# Patient Record
Sex: Female | Born: 1990 | Race: White | Hispanic: No | Marital: Single | Smoking: Never smoker
Health system: Southern US, Community
[De-identification: ages and names within clinical notes are randomized; demographics above are authoritative.]

## PROBLEM LIST (undated history)

## (undated) ENCOUNTER — Inpatient Hospital Stay: Payer: Self-pay

## (undated) DIAGNOSIS — D649 Anemia, unspecified: Secondary | ICD-10-CM

## (undated) DIAGNOSIS — F32A Depression, unspecified: Secondary | ICD-10-CM

## (undated) DIAGNOSIS — R112 Nausea with vomiting, unspecified: Secondary | ICD-10-CM

## (undated) DIAGNOSIS — Z5189 Encounter for other specified aftercare: Secondary | ICD-10-CM

## (undated) DIAGNOSIS — F419 Anxiety disorder, unspecified: Secondary | ICD-10-CM

## (undated) DIAGNOSIS — W3400XA Accidental discharge from unspecified firearms or gun, initial encounter: Secondary | ICD-10-CM

## (undated) DIAGNOSIS — F329 Major depressive disorder, single episode, unspecified: Secondary | ICD-10-CM

## (undated) HISTORY — PX: ABDOMINAL SURGERY: SHX537

## (undated) HISTORY — DX: Encounter for other specified aftercare: Z51.89

## (undated) HISTORY — DX: Nausea with vomiting, unspecified: R11.2

---
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
## 2012-03-29 ENCOUNTER — Emergency Department: Payer: Self-pay | Admitting: *Deleted

## 2012-03-29 LAB — CBC
HCT: 36.9 % (ref 35.0–47.0)
MCH: 26.2 pg (ref 26.0–34.0)
MCV: 80 fL (ref 80–100)
Platelet: 143 10*3/uL — ABNORMAL LOW (ref 150–440)
RDW: 19.3 % — ABNORMAL HIGH (ref 11.5–14.5)

## 2012-03-29 LAB — DRUG SCREEN, URINE
Amphetamines, Ur Screen: NEGATIVE (ref ?–1000)
Barbiturates, Ur Screen: NEGATIVE (ref ?–200)
Benzodiazepine, Ur Scrn: NEGATIVE (ref ?–200)
Methadone, Ur Screen: NEGATIVE (ref ?–300)
Opiate, Ur Screen: NEGATIVE (ref ?–300)
Tricyclic, Ur Screen: NEGATIVE (ref ?–1000)

## 2012-03-29 LAB — COMPREHENSIVE METABOLIC PANEL
Alkaline Phosphatase: 71 U/L — ABNORMAL LOW (ref 82–169)
Anion Gap: 7 (ref 7–16)
BUN: 16 mg/dL (ref 9–21)
Calcium, Total: 8.9 mg/dL — ABNORMAL LOW (ref 9.0–10.7)
Co2: 27 mmol/L — ABNORMAL HIGH (ref 16–25)
EGFR (Non-African Amer.): >60
Glucose: 103 mg/dL — ABNORMAL HIGH (ref 65–99)
Potassium: 4.2 mmol/L (ref 3.3–4.7)
SGPT (ALT): 21 U/L
Total Protein: 6.7 g/dL (ref 6.4–8.6)

## 2012-03-29 LAB — ETHANOL
Ethanol %: <0.003 % (ref 0.000–0.080)
Ethanol: <3 mg/dL

## 2012-03-29 LAB — SALICYLATE LEVEL: Salicylates, Serum: <1.7 mg/dL

## 2012-03-29 LAB — TSH: Thyroid Stimulating Horm: 2.78 u[IU]/mL

## 2012-05-22 ENCOUNTER — Emergency Department: Payer: Self-pay | Admitting: Emergency Medicine

## 2012-05-22 LAB — DRUG SCREEN, URINE
Barbiturates, Ur Screen: NEGATIVE (ref ?–200)
Benzodiazepine, Ur Scrn: NEGATIVE (ref ?–200)
Cocaine Metabolite,Ur ~~LOC~~: NEGATIVE (ref ?–300)
Opiate, Ur Screen: NEGATIVE (ref ?–300)

## 2012-05-22 LAB — CBC
HCT: 38.4 % (ref 35.0–47.0)
HGB: 12.8 g/dL (ref 12.0–16.0)
MCH: 27.7 pg (ref 26.0–34.0)
MCHC: 33.4 g/dL (ref 32.0–36.0)
RDW: 14.9 % — ABNORMAL HIGH (ref 11.5–14.5)
WBC: 10.3 10*3/uL (ref 3.6–11.0)

## 2012-05-22 LAB — COMPREHENSIVE METABOLIC PANEL
Albumin: 4.2 g/dL (ref 3.8–5.6)
Alkaline Phosphatase: 78 U/L — ABNORMAL LOW (ref 82–169)
Anion Gap: 8 (ref 7–16)
BUN: 14 mg/dL (ref 9–21)
Calcium, Total: 9 mg/dL (ref 9.0–10.7)
Chloride: 108 mmol/L — ABNORMAL HIGH (ref 97–107)
Co2: 24 mmol/L (ref 16–25)
EGFR (African American): >60
EGFR (Non-African Amer.): >60
Osmolality: 279 (ref 275–301)
Potassium: 3.8 mmol/L (ref 3.3–4.7)

## 2012-05-22 LAB — URINALYSIS, COMPLETE
Bilirubin,UR: NEGATIVE
Blood: NEGATIVE
Leukocyte Esterase: NEGATIVE
Nitrite: NEGATIVE
Ph: 6 (ref 4.5–8.0)
RBC,UR: 1 /HPF (ref 0–5)

## 2012-05-22 LAB — ETHANOL: Ethanol: <3 mg/dL

## 2012-05-22 LAB — PREGNANCY, URINE: Pregnancy Test, Urine: NEGATIVE m[IU]/mL

## 2012-06-17 ENCOUNTER — Emergency Department: Payer: Self-pay | Admitting: Emergency Medicine

## 2012-06-17 LAB — URINALYSIS, COMPLETE
Bilirubin,UR: NEGATIVE
Glucose,UR: NEGATIVE mg/dL (ref 0–75)
Ketone: NEGATIVE
Nitrite: POSITIVE
Protein: NEGATIVE
RBC,UR: 4 /HPF (ref 0–5)
Specific Gravity: 1.014 (ref 1.003–1.030)
WBC UR: 24 /HPF (ref 0–5)

## 2012-06-17 LAB — PREGNANCY, URINE: Pregnancy Test, Urine: NEGATIVE m[IU]/mL

## 2012-07-13 ENCOUNTER — Emergency Department: Payer: Self-pay | Admitting: Emergency Medicine

## 2012-07-13 LAB — URINALYSIS, COMPLETE
Bilirubin,UR: NEGATIVE
Blood: NEGATIVE
Glucose,UR: NEGATIVE mg/dL (ref 0–75)
Specific Gravity: 1.03 (ref 1.003–1.030)
Squamous Epithelial: 1
WBC UR: 2 /HPF (ref 0–5)

## 2012-08-16 ENCOUNTER — Inpatient Hospital Stay: Payer: Self-pay | Admitting: Psychiatry

## 2012-08-16 LAB — ETHANOL: Ethanol %: <0.003 % (ref 0.000–0.080)

## 2012-08-16 LAB — URINALYSIS, COMPLETE
Bilirubin,UR: NEGATIVE
Hyaline Cast: 3
Ketone: NEGATIVE
Nitrite: NEGATIVE
Ph: 6 (ref 4.5–8.0)
RBC,UR: 1 /HPF (ref 0–5)
Specific Gravity: 1.024 (ref 1.003–1.030)
WBC UR: 6 /HPF (ref 0–5)

## 2012-08-16 LAB — ACETAMINOPHEN LEVEL: Acetaminophen: <2 ug/mL

## 2012-08-16 LAB — COMPREHENSIVE METABOLIC PANEL
Albumin: 4 g/dL (ref 3.8–5.6)
Alkaline Phosphatase: 77 U/L — ABNORMAL LOW (ref 82–169)
BUN: 11 mg/dL (ref 9–21)
Bilirubin,Total: 0.6 mg/dL (ref 0.2–1.0)
Chloride: 108 mmol/L — ABNORMAL HIGH (ref 97–107)
Creatinine: 0.75 mg/dL (ref 0.60–1.30)
EGFR (African American): >60
EGFR (Non-African Amer.): >60
Glucose: 79 mg/dL (ref 65–99)
Potassium: 3.8 mmol/L (ref 3.3–4.7)
SGOT(AST): 18 U/L (ref 0–26)
Sodium: 141 mmol/L (ref 132–141)
Total Protein: 6.9 g/dL (ref 6.4–8.6)

## 2012-08-16 LAB — SALICYLATE LEVEL: Salicylates, Serum: <1.7 mg/dL

## 2012-08-16 LAB — CBC
HGB: 12.9 g/dL (ref 12.0–16.0)
MCH: 28.1 pg (ref 26.0–34.0)
MCHC: 33.3 g/dL (ref 32.0–36.0)
Platelet: 171 10*3/uL (ref 150–440)
RBC: 4.58 10*6/uL (ref 3.80–5.20)
RDW: 14.8 % — ABNORMAL HIGH (ref 11.5–14.5)
WBC: 11 10*3/uL (ref 3.6–11.0)

## 2012-08-16 LAB — DRUG SCREEN, URINE
Cannabinoid 50 Ng, Ur ~~LOC~~: POSITIVE (ref ?–50)
Cocaine Metabolite,Ur ~~LOC~~: NEGATIVE (ref ?–300)
MDMA (Ecstasy)Ur Screen: NEGATIVE (ref ?–500)
Opiate, Ur Screen: NEGATIVE (ref ?–300)
Tricyclic, Ur Screen: NEGATIVE (ref ?–1000)

## 2012-08-16 LAB — PREGNANCY, URINE: Pregnancy Test, Urine: NEGATIVE m[IU]/mL

## 2012-08-16 LAB — TSH: Thyroid Stimulating Horm: 2.25 u[IU]/mL

## 2013-01-13 ENCOUNTER — Emergency Department: Payer: Self-pay | Admitting: Emergency Medicine

## 2013-01-21 ENCOUNTER — Emergency Department: Payer: Self-pay | Admitting: Emergency Medicine

## 2013-01-23 ENCOUNTER — Emergency Department: Payer: Self-pay | Admitting: Emergency Medicine

## 2013-05-23 ENCOUNTER — Emergency Department: Payer: Self-pay | Admitting: Unknown Physician Specialty

## 2013-05-23 LAB — CBC
MCH: 28.8 pg (ref 26.0–34.0)
MCV: 85 fL (ref 80–100)
RBC: 4.63 10*6/uL (ref 3.80–5.20)
RDW: 13.4 % (ref 11.5–14.5)

## 2013-05-23 LAB — URINALYSIS, COMPLETE
Bilirubin,UR: NEGATIVE
Glucose,UR: NEGATIVE mg/dL (ref 0–75)
Ketone: NEGATIVE
Leukocyte Esterase: NEGATIVE
Nitrite: NEGATIVE
Specific Gravity: 1.02 (ref 1.003–1.030)

## 2013-05-23 LAB — BASIC METABOLIC PANEL
Anion Gap: 4 — ABNORMAL LOW (ref 7–16)
BUN: 12 mg/dL (ref 7–18)
Co2: 28 mmol/L (ref 21–32)
Creatinine: 0.7 mg/dL (ref 0.60–1.30)
EGFR (African American): >60
Glucose: 88 mg/dL (ref 65–99)
Potassium: 4 mmol/L (ref 3.5–5.1)

## 2013-05-28 ENCOUNTER — Emergency Department: Payer: Self-pay | Admitting: Emergency Medicine

## 2014-01-06 ENCOUNTER — Emergency Department: Payer: Self-pay | Admitting: Emergency Medicine

## 2014-01-27 ENCOUNTER — Emergency Department: Payer: Self-pay | Admitting: Emergency Medicine

## 2014-01-30 ENCOUNTER — Emergency Department: Payer: Self-pay | Admitting: Emergency Medicine

## 2014-01-30 LAB — COMPREHENSIVE METABOLIC PANEL
ALBUMIN: 4 g/dL (ref 3.4–5.0)
ALT: 21 U/L (ref 12–78)
AST: 24 U/L (ref 15–37)
Alkaline Phosphatase: 81 U/L
Anion Gap: 3 — ABNORMAL LOW (ref 7–16)
BUN: 11 mg/dL (ref 7–18)
Bilirubin,Total: 0.8 mg/dL (ref 0.2–1.0)
CALCIUM: 8.8 mg/dL (ref 8.5–10.1)
Chloride: 109 mmol/L — ABNORMAL HIGH (ref 98–107)
Co2: 28 mmol/L (ref 21–32)
Creatinine: 0.69 mg/dL (ref 0.60–1.30)
EGFR (African American): >60
EGFR (Non-African Amer.): >60
Glucose: 84 mg/dL (ref 65–99)
OSMOLALITY: 278 (ref 275–301)
Potassium: 3.6 mmol/L (ref 3.5–5.1)
SODIUM: 140 mmol/L (ref 136–145)
Total Protein: 6.9 g/dL (ref 6.4–8.2)

## 2014-01-30 LAB — CBC WITH DIFFERENTIAL/PLATELET
Basophil #: 0 10*3/uL (ref 0.0–0.1)
Basophil %: 0.3 %
EOS PCT: 0.5 %
Eosinophil #: 0 10*3/uL (ref 0.0–0.7)
HCT: 37.6 % (ref 35.0–47.0)
HGB: 13.1 g/dL (ref 12.0–16.0)
LYMPHS ABS: 1.5 10*3/uL (ref 1.0–3.6)
LYMPHS PCT: 22.7 %
MCH: 29.6 pg (ref 26.0–34.0)
MCHC: 34.7 g/dL (ref 32.0–36.0)
MCV: 85 fL (ref 80–100)
Monocyte #: 0.8 x10 3/mm (ref 0.2–0.9)
Monocyte %: 11.7 %
Neutrophil #: 4.2 10*3/uL (ref 1.4–6.5)
Neutrophil %: 64.8 %
Platelet: 149 10*3/uL — ABNORMAL LOW (ref 150–440)
RBC: 4.42 10*6/uL (ref 3.80–5.20)
RDW: 13.1 % (ref 11.5–14.5)
WBC: 6.4 10*3/uL (ref 3.6–11.0)

## 2014-01-30 LAB — URINALYSIS, COMPLETE
Bilirubin,UR: NEGATIVE
Glucose,UR: NEGATIVE mg/dL (ref 0–75)
Leukocyte Esterase: NEGATIVE
NITRITE: NEGATIVE
Ph: 7 (ref 4.5–8.0)
Protein: NEGATIVE
RBC,UR: 6 /HPF (ref 0–5)
SPECIFIC GRAVITY: 1.024 (ref 1.003–1.030)
Squamous Epithelial: 3

## 2014-02-01 ENCOUNTER — Emergency Department: Payer: Self-pay | Admitting: Emergency Medicine

## 2014-03-02 ENCOUNTER — Emergency Department: Payer: Self-pay | Admitting: Emergency Medicine

## 2014-05-12 ENCOUNTER — Emergency Department: Payer: Self-pay | Admitting: Emergency Medicine

## 2014-05-22 ENCOUNTER — Emergency Department: Payer: Self-pay | Admitting: Emergency Medicine

## 2014-05-22 LAB — URINALYSIS, COMPLETE
BILIRUBIN, UR: NEGATIVE
GLUCOSE, UR: NEGATIVE mg/dL (ref 0–75)
Ketone: NEGATIVE
Leukocyte Esterase: NEGATIVE
Nitrite: NEGATIVE
PH: 5 (ref 4.5–8.0)
Protein: NEGATIVE
RBC,UR: 6 /HPF (ref 0–5)
Specific Gravity: 1.025 (ref 1.003–1.030)
WBC UR: 2 /HPF (ref 0–5)

## 2014-05-22 LAB — CBC
HCT: 41.5 % (ref 35.0–47.0)
HGB: 13.9 g/dL (ref 12.0–16.0)
MCH: 29.3 pg (ref 26.0–34.0)
MCHC: 33.6 g/dL (ref 32.0–36.0)
MCV: 87 fL (ref 80–100)
PLATELETS: 192 10*3/uL (ref 150–440)
RBC: 4.76 10*6/uL (ref 3.80–5.20)
RDW: 12.1 % (ref 11.5–14.5)
WBC: 10.7 10*3/uL (ref 3.6–11.0)

## 2014-05-22 LAB — BASIC METABOLIC PANEL
Anion Gap: 7 (ref 7–16)
BUN: 14 mg/dL (ref 7–18)
CO2: 26 mmol/L (ref 21–32)
Calcium, Total: 9.6 mg/dL (ref 8.5–10.1)
Chloride: 107 mmol/L (ref 98–107)
Creatinine: 0.74 mg/dL (ref 0.60–1.30)
EGFR (African American): >60
EGFR (Non-African Amer.): >60
GLUCOSE: 107 mg/dL — AB (ref 65–99)
OSMOLALITY: 280 (ref 275–301)
POTASSIUM: 4 mmol/L (ref 3.5–5.1)
SODIUM: 140 mmol/L (ref 136–145)

## 2014-05-22 LAB — LIPASE, BLOOD: Lipase: 130 U/L (ref 73–393)

## 2014-05-26 ENCOUNTER — Emergency Department: Payer: Self-pay | Admitting: Emergency Medicine

## 2014-05-26 LAB — URINALYSIS, COMPLETE
Bacteria: NONE SEEN
Bilirubin,UR: NEGATIVE
Glucose,UR: NEGATIVE mg/dL (ref 0–75)
KETONE: NEGATIVE
Leukocyte Esterase: NEGATIVE
Nitrite: NEGATIVE
PH: 5 (ref 4.5–8.0)
Protein: NEGATIVE
RBC,UR: 4 /HPF (ref 0–5)
Specific Gravity: 1.016 (ref 1.003–1.030)
Squamous Epithelial: 1
WBC UR: <1 /HPF (ref 0–5)

## 2014-09-06 ENCOUNTER — Emergency Department: Payer: Self-pay | Admitting: Emergency Medicine

## 2014-10-08 ENCOUNTER — Emergency Department: Payer: Self-pay | Admitting: Emergency Medicine

## 2015-03-17 NOTE — H&P (Signed)
PATIENT NAME:  Renee [name], Renee [name] MR#:  [id number] DATE OF BIRTH:  [birth year]  DATE OF ADMISSION:  08/16/2012  IDENTIFYING INFORMATION AND CHIEF COMPLAINT: 24 year old woman who came to the Emergency Room voluntarily asking for evaluation of her depression and suicidal ideation.   CHIEF COMPLAINT: "I need to be here."   HISTORY OF PRESENT ILLNESS: The patient states that she has had problems with depression for [name] long time, but that it has been particularly bad for this week. She has been fighting with her boyfriend nonstop. This morning she got up and immediately she and her boyfriend were having another fight. She began to have thoughts about overdosing or otherwise trying to kill herself and decided that she needed to come to the Emergency Room to get treatment for her depression. She feels angry and sad frequently, has crying spells. She sleeps poorly. Appetite has been poor and she has lost some weight. Denies hallucinations. Denies any obvious psychotic symptoms. She is currently not taking any psychiatric medicine. She denies that she abuses drugs or alcohol, although her drug screen is positive for cocaine. The patient has significant stress from being the mother of two very young children. She is not working. She has chronic conflict with her boyfriend. They have financial difficulties and the patient's mother died at the beginning of this year. The patient states that since that time she has really never felt right and she feels like that has been [name] major source of depression for her.   PAST PSYCHIATRIC HISTORY: The patient has never from what she tells me been admitted to the hospital, although she has been here on more than one occasion after having overdosed on medication. She has [facility] for [facility]. She says she has been on several medications including Prozac and Celexa, but she thinks that they made her have more suicidal thoughts so she stopped them.  She has seen [name] therapist in the past but did not think it was helpful. She has not seen anyone for the past two months. 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 She says that people have brought up the possibility of bipolar disorder with her but she does not describe any euphoric mania but rather [name] chronic problem with anger outbursts. She denies having substance abuse problems.   SOCIAL HISTORY: The patient only went through the ninth grade in school. She has been with her current boyfriend ever since she was 24 years old. She has two children, ages two years and nine months. The boyfriend is working but at [name] fairly low level. The patient's mother died in January of this year, which was [name] huge blow to her. She only has contact otherwise with her sisters and 1 or 2 casual friends. The patient has never been employed. She really has very little experience of life outside of being with her boyfriend and her immediate family. She does have Medicaid.   PAST MEDICAL HISTORY: Denies any significant ongoing medical problems. Not on any current medications. No known drug allergies.   FAMILY HISTORY: Reports that she has [name] family history of depression in several people including her mother and grandmother and at least one sister. She says she has one sister who is currently in [name] group home, but she cannot really describe to me why.   REVIEW OF SYSTEMS: The patient complains of depressed mood,  fatigue, low appetite with weight loss, intermittent sleep with frequent awakening at night. She also complains of suicidal thoughts with thoughts of overdosing on her medicine, feeling like nobody likes her, feeling like she has no place in the world. Does not describe any pain problems or other acute medical issues.   MENTAL STATUS EXAM: Somewhat disheveled woman interviewed in the Emergency Room. She was cooperative and  appropriate during the interview. Good eye contact. Normal psychomotor activity. Speech normal in rate, tone, and volume especially for affective content. Affect was tearful, upset, crying, dysphoric. Mood was stated as depressed. Thoughts were lucid with no obvious loosening of associations or bizarre statements or evidence of delusions or paranoia. Denies hallucinations. Denies any homicidal intent although she says that at times she has thought about burning down the trailers of other people in her [place] because they get in her business. She has had some suicidal thoughts but says she has not acted on them because of her children. The patient appears to probably be of average intelligence. Immediate short-term memory grossly intact. Alert and oriented times four, able to make decisions about her immediate care.   PHYSICAL EXAMINATION:  GENERAL: This is [name] thin and small but generally healthy-looking 24 year old woman. Weight 117 pounds. She does not appear to be in any acute physical distress. Face is slightly asymmetric especially in her jaw. She has particularly bad dentition with [name] lot of misaligned teeth. Face also looks slightly lopsided, not in an obviously grotesque way.   HEENT: Pupils are equal and reactive. No acute skin lesions. Mucosa normal.   NECK AND BACK: Nontender.   MUSCULOSKELETAL: Full range of motion at all extremities. Normal gait. Strength and reflexes normal and symmetric.   NEUROLOGIC: Cranial nerves symmetric.   LUNGS: Clear to auscultation with no extra sounds.   HEART: Regular rate and rhythm.   ABDOMEN: Soft, nontender, normal bowel sounds.   VITAL SIGNS: Temperature 97.5, pulse 78, respirations 20, blood pressure 102/51.   LABORATORY RESULTS: Pregnancy test negative. Urinalysis borderline for urinalysis, probably not positive but we may repeat it. Drug screen positive for cannabis. TSH normal. CBC normal. Alcohol undetected. Chemistry panel shows chloride  108, slightly elevated, alkaline phosphatase 77, slightly low, otherwise totally normal.   ASSESSMENT: This is an 24 year old woman with multiple symptoms of major depression with suicidal ideation, multiple severe stresses on her, history of poor coping skills and suicide attempts in the past. The patient is appropriate for hospitalization because of suicidal ideation,  severity of depression, and failure of outpatient response to treatment, lack of current outpatient provider. Feeling unsafe at her home.   TREATMENT PLAN: Admit to psychiatry. Review labs. Continue vital signs. Put her on close precautions to start with, observe her interactions on the unit. Get her involved in groups and activities for mood stabilization. Get collateral history from the family. Because she reports having failed or had severe side effects to at least two regular antidepressants and because of her history of mood lability, I think it would be reasonable to try Seroquel as [name] next medication for depression. We will start Seroquel 100 mg at night. I do not think that she has bipolar disorder, but I think this would be [name] reasonable try for an antidepressant effect, particularly since it may help with her sleep and appetite and she has Medicaid so she can afford it hopefully.   DIAGNOSIS PRINCIPLE AND PRIMARY:  AXIS I: Major depressive episode, severe, recurrent.   SECONDARY DIAGNOSES:  AXIS I: No further diagnosis.   AXIS II: Deferred.   AXIS III: No diagnosis.   AXIS IV: Multiple severe stresses including being [name] mother of two young children with little other help, recent death in the family, conflict with her live-in boyfriend.   AXIS V: Functioning at time of evaluation: 35.      ____________________________ Audery Amel, MD jtc:bjt D: 08/16/2012 12:14:18 ET T: 08/16/2012 13:30:23 ET JOB#: [id number]  cc: Audery Amel, MD, <Dictator> Audery Amel MD ELECTRONICALLY SIGNED 08/16/2012 17:45

## 2015-03-17 NOTE — Discharge Summary (Signed)
PATIENT NAME:  Renee [name], Renee [name] MR#:  [id number] DATE OF BIRTH:  [birth year]  DATE OF ADMISSION:  08/16/2012 DATE OF DISCHARGE:  08/20/2012  HOSPITAL COURSE: See dictated history and physical for details of admission. This 24 year old woman presented to the Emergency Room with complaints of suicidal ideation after [name] fight with her boyfriend. Felt like her mood was getting out of control. She was initially very depressed and down and negative. In the hospital she did not show any suicidal behavior. She was cooperative with treatment programming and appeared to benefit from groups and individual therapy. She was treated with quetiapine for depression and mood instability because of [name] past history she gave of poor response to an antidepressant. She tolerated the medication well and had improved sleep and felt calmer. By the time of discharge patient was completely denying any suicidal ideation, was feeling more confident, felt better able to cope with her relationship with her boyfriend. No new physical symptoms identified during hospital stay. She agreed to discharge planning [facility]   DISCHARGE MEDICATION: Quetiapine 100 mg p.o. at bedtime.   LABORATORY, DIAGNOSTIC AND RADIOLOGICAL DATA: Admission labs showed [name] chemistry panel with [name] slightly high chloride at 108, alkaline phosphatase low at 77. No other abnormalities. Alcohol undetectable. TSH normal at 2.25. Drug screen positive for cannabis. The CBC was entirely normal. Urinalysis probably contaminated, doubtful urinary tract infection. Pregnancy test negative.   MENTAL STATUS EXAM AT DISCHARGE: Neatly dressed and groomed woman looks her stated age. Good eye contact. Normal psychomotor activity. Speech normal in rate, tone, and volume. Affect euthymic, reactive and appropriate. Mood stated as being much better. Thoughts are lucid and directed with no evidence of thought disorder or loosening of associations. Denies any suicidal or  homicidal ideation. Expresses optimism and feels upbeat about discharge planning. Normal intelligence. Short and long-term memory grossly intact. Good insight and judgment.   DISPOSITION: Discharge back home where she has been living with her boyfriend and children. [facility]    DIAGNOSIS PRINCIPLE AND PRIMARY:  AXIS I: Depression, not otherwise specified.   SECONDARY DIAGNOSES:  AXIS I: Cannabis abuse.   AXIS II: Deferred.   AXIS III: No diagnosis.   AXIS IV: Moderate to severe from poor resources and limited social opportunity.   AXIS V: Functioning at time of discharge 57.   ____________________________ Audery AmelJohn T. Clapacs, MD jtc:cms D: 08/23/2012 22:49:17 ET T: 08/24/2012 12:11:04 ET JOB#: [id number]  cc: Audery AmelJohn T. Clapacs, MD, <Dictator> Audery AmelJOHN T CLAPACS MD ELECTRONICALLY SIGNED 08/26/2012 11:27

## 2015-11-02 ENCOUNTER — Emergency Department
Admission: EM | Admit: 2015-11-02 | Discharge: 2015-11-02 | Disposition: A | Discharge status: Home / Self Care | Payer: Medicare Other | Attending: Emergency Medicine | Admitting: Emergency Medicine

## 2015-11-02 DIAGNOSIS — J029 Acute pharyngitis, unspecified: Secondary | ICD-10-CM | POA: Insufficient documentation

## 2015-11-02 LAB — POCT RAPID STREP A: Streptococcus, Group A Screen (Direct): NEGATIVE

## 2015-11-02 MED ORDER — AZITHROMYCIN 250 MG PO TABS: ORAL_TABLET | ORAL | Status: DC

## 2015-11-02 MED ORDER — ACETAMINOPHEN-CODEINE #3 300-30 MG PO TABS
2.0000 | ORAL_TABLET | Freq: Once | ORAL | Status: AC
  Administered 2015-11-02: 2 via ORAL
  Filled 2015-11-02: qty 2

## 2015-11-02 MED ORDER — LIDOCAINE VISCOUS 2 % MT SOLN: 20.0000 mL | OROMUCOSAL | Status: DC | PRN

## 2015-11-02 MED ORDER — ACETAMINOPHEN-CODEINE #3 300-30 MG PO TABS: 2.0000 | ORAL_TABLET | ORAL | Status: DC | PRN

## 2015-11-02 NOTE — ED Provider Notes (Signed)
[facility] Emergency Department Provider Note  ____________________________________________  Time seen: Approximately 2:10 PM  I have reviewed the triage vital signs and the nursing notes.   HISTORY  Chief Complaint Sore Throat   HPI Renee [name] is a 24 y.o. female who presents for evaluation of a sore throat onset last night. Patient states she has had fever chills on and off associated with difficulty swallowing. Denies any recent cough cold or congestion.   History reviewed. No pertinent past medical history.  There are no active problems to display for this patient.   Past Surgical History  Procedure Laterality Date  . Cesarean section      Current Outpatient Rx  Name  Route  Sig  Dispense  Refill  . acetaminophen-codeine (TYLENOL #3) 300-30 MG tablet   Oral   Take 2 tablets by mouth every 4 (four) hours as needed for moderate pain.   12 tablet   0   . azithromycin (ZITHROMAX Z-PAK) 250 MG tablet      Take 2 tablets (500 mg) on  Day 1,  followed by 1 tablet (250 mg) once daily on Days 2 through 5.   6 each   0   . lidocaine (XYLOCAINE) 2 % solution   Mouth/Throat   Use as directed 20 mLs in the mouth or throat as needed for mouth pain.   100 mL   0     Allergies Review of patient's allergies indicates no known allergies.  No family history on file.  Social History Social History  Substance Use Topics  . Smoking status: Never Smoker   . Smokeless tobacco: None  . Alcohol Use: No    Review of Systems Constitutional: Positive fevers Eyes: No visual changes. ENT: Positive sore throat Cardiovascular: Denies chest pain. Respiratory: Denies shortness of breath. Gastrointestinal: No abdominal pain.  No nausea, no vomiting.  No diarrhea.  No constipation. Genitourinary: Negative for dysuria. Musculoskeletal: Negative for back pain. Skin: Negative for rash. Neurological: Negative for headaches, focal weakness or  numbness.  10-point ROS otherwise negative.  ____________________________________________   PHYSICAL EXAM:  VITAL SIGNS: ED Triage Vitals  Enc Vitals Group     BP 11/02/15 1214 125/59 mmHg     Pulse Rate 11/02/15 1214 113     Resp 11/02/15 1214 16     Temp 11/02/15 1214 98.4 F (36.9 C)     Temp Source 11/02/15 1214 Oral     SpO2 11/02/15 1214 98 %     Weight 11/02/15 1214 112 lb (50.803 kg)     Height 11/02/15 1214  (1.6 m)     Head Cir --      Peak Flow --      Pain Score 11/02/15 1215 10     Pain Loc --      Pain Edu? --      Excl. in GC? --     Constitutional: Alert and oriented. Well appearing and in no acute distress. Eyes: Conjunctivae are normal. PERRL. EOMI. Head: Atraumatic. Nose: No congestion/rhinnorhea. Mouth/Throat: Mucous membranes are moist.  Oropharynx extremely erythematous with tonsillar enlargement. Neck: No stridor.   Cardiovascular: Normal rate, regular rhythm. Grossly normal heart sounds.  Good peripheral circulation. Respiratory: Normal respiratory effort.  No retractions. Lungs CTAB. Musculoskeletal: No lower extremity tenderness nor edema.  No joint effusions. Neurologic:  Normal speech and language. No gross focal neurologic deficits are appreciated. No gait instability. Skin:  Skin is warm, dry and intact. No rash  noted. Psychiatric: Mood and affect are normal. Speech and behavior are normal.  ____________________________________________   LABS (all labs ordered are listed, but only abnormal results are displayed)  Labs Reviewed  POCT RAPID STREP A      PROCEDURES  Procedure(s) performed: None  Critical Care performed: No  ____________________________________________   INITIAL IMPRESSION / ASSESSMENT AND PLAN / ED COURSE  Pertinent labs & imaging results that were available during my care of the patient were reviewed by me and considered in my medical decision making (see chart for details).  Acute excessive  tonsillitis. Rx given for Zithromax, viscous lidocaine, and Tylenol 3 for pain. Patient follow-up with PCP or return to ER with any worsening symptomology. Patient voices no other emergency medical complaints at this time. ____________________________________________   FINAL CLINICAL IMPRESSION(S) / ED DIAGNOSES  Final diagnoses:  Acute pharyngitis, unspecified pharyngitis type      Evangeline Dakinharles M Beers, PA-C 11/02/15 1554  Jene Everyobert Kinner, MD 11/02/15 [phone]

## 2015-11-02 NOTE — Discharge Instructions (Signed)
Pharyngitis Pharyngitis is redness, pain, and swelling (inflammation) of your pharynx.  CAUSES  Pharyngitis is usually caused by infection. Most of the time, these infections are from viruses (viral) and are part of a cold. However, sometimes pharyngitis is caused by bacteria (bacterial). Pharyngitis can also be caused by allergies. Viral pharyngitis may be spread from person to person by coughing, sneezing, and personal items or utensils (cups, forks, spoons, toothbrushes). Bacterial pharyngitis may be spread from person to person by more intimate contact, such as kissing.  SIGNS AND SYMPTOMS  Symptoms of pharyngitis include:   Sore throat.   Tiredness (fatigue).   Low-grade fever.   Headache.  Joint pain and muscle aches.  Skin rashes.  Swollen lymph nodes.  Plaque-like film on throat or tonsils (often seen with bacterial pharyngitis). DIAGNOSIS  Your health care provider will ask you questions about your illness and your symptoms. Your medical history, along with a physical exam, is often all that is needed to diagnose pharyngitis. Sometimes, a rapid strep test is done. Other lab tests may also be done, depending on the suspected cause.  TREATMENT  Viral pharyngitis will usually get better in 3-4 days without the use of medicine. Bacterial pharyngitis is treated with medicines that kill germs (antibiotics).  HOME CARE INSTRUCTIONS   Drink enough water and fluids to keep your urine clear or pale yellow.   Only take over-the-counter or prescription medicines as directed by your health care provider:   If you are prescribed antibiotics, make sure you finish them even if you start to feel better.   Do not take aspirin.   Get lots of rest.   Gargle with 8 oz of salt water ( tsp of salt per 1 qt of water) as often as every 1-2 hours to soothe your throat.   Throat lozenges (if you are not at risk for choking) or sprays may be used to soothe your throat. SEEK MEDICAL  CARE IF:   You have large, tender lumps in your neck.  You have a rash.  You cough up green, yellow-brown, or bloody spit. SEEK IMMEDIATE MEDICAL CARE IF:   Your neck becomes stiff.  You drool or are unable to swallow liquids.  You vomit or are unable to keep medicines or liquids down.  You have severe pain that does not go away with the use of recommended medicines.  You have trouble breathing (not caused by a stuffy nose). MAKE SURE YOU:   Understand these instructions.  Will watch your condition.  Will get help right away if you are not doing well or get worse.   This information is not intended to replace advice given to you by your health care provider. Make sure you discuss any questions you have with your health care provider.   Document Released: 11/14/2005 Document Revised: 09/04/2013 Document Reviewed: 07/22/2013 Elsevier Interactive Patient Education 2016 [employer]  Sore Throat A sore throat is pain, burning, irritation, or scratchiness of the throat. There is often pain or tenderness when swallowing or talking. A sore throat may be accompanied by other symptoms, such as coughing, sneezing, fever, and swollen neck glands. A sore throat is often the first sign of another sickness, such as a cold, flu, strep throat, or mononucleosis (commonly known as mono). Most sore throats go away without medical treatment. CAUSES  The most common causes of a sore throat include:  A viral infection, such as a cold, flu, or mono.  A bacterial infection, such as strep throat,  tonsillitis, or whooping cough. °· Seasonal allergies. °· Dryness in the air. °· Irritants, such as smoke or pollution. °· Gastroesophageal reflux disease (GERD). °HOME CARE INSTRUCTIONS  °· Only take over-the-counter medicines as directed by your caregiver. °· Drink enough fluids to keep your urine clear or pale yellow. °· Rest as needed. °· Try using throat sprays, lozenges, or sucking on hard candy to ease  any pain (if older than 4 years or as directed). °· Sip warm liquids, such as broth, herbal tea, or warm water with honey to relieve pain temporarily. You may also eat or drink cold or frozen liquids such as frozen ice pops. °· Gargle with salt water (mix 1 tsp salt with 8 oz of water). °· Do not smoke and avoid secondhand smoke. °· Put a cool-mist humidifier in your bedroom at night to moisten the air. You can also turn on a hot shower and sit in the bathroom with the door closed for 5-10 minutes. °SEEK IMMEDIATE MEDICAL CARE IF: °· You have difficulty breathing. °· You are unable to swallow fluids, soft foods, or your saliva. °· You have increased swelling in the throat. °· Your sore throat does not get better in 7 days. °· You have nausea and vomiting. °· You have a fever or persistent symptoms for more than 2-3 days. °· You have a fever and your symptoms suddenly get worse. °MAKE SURE YOU:  °· Understand these instructions. °· Will watch your condition. °· Will get help right away if you are not doing well or get worse. °  °This information is not intended to replace advice given to you by your health care provider. Make sure you discuss any questions you have with your health care provider. °  °Document Released: 12/22/2004 Document Revised: 12/05/2014 Document Reviewed: 07/22/2012 °Elsevier Interactive Patient Education ©2016 Elsevier Inc. ° °

## 2015-11-02 NOTE — ED Notes (Signed)
Pt c/o sore throat since yesterday 

## 2015-11-06 LAB — CULTURE, GROUP A STREP (THRC)

## 2016-02-06 ENCOUNTER — Emergency Department
Admission: EM | Admit: 2016-02-06 | Discharge: 2016-02-06 | Disposition: A | Discharge status: Other Acute Inpatient Hospital | Payer: Medicare Other | Attending: Emergency Medicine | Admitting: Emergency Medicine

## 2016-02-06 ENCOUNTER — Emergency Department: Payer: Medicare Other

## 2016-02-06 ENCOUNTER — Encounter: Payer: Self-pay | Admitting: Emergency Medicine

## 2016-02-06 DIAGNOSIS — F419 Anxiety disorder, unspecified: Secondary | ICD-10-CM | POA: Insufficient documentation

## 2016-02-06 DIAGNOSIS — R109 Unspecified abdominal pain: Secondary | ICD-10-CM | POA: Diagnosis present

## 2016-02-06 DIAGNOSIS — K769 Liver disease, unspecified: Secondary | ICD-10-CM | POA: Insufficient documentation

## 2016-02-06 HISTORY — DX: Accidental discharge from unspecified firearms or gun, initial encounter: W34.00XA

## 2016-02-06 LAB — URINALYSIS COMPLETE WITH MICROSCOPIC (ARMC ONLY)
BILIRUBIN URINE: NEGATIVE
Bacteria, UA: NONE SEEN
Glucose, UA: NEGATIVE mg/dL
KETONES UR: NEGATIVE mg/dL
Leukocytes, UA: NEGATIVE
NITRITE: NEGATIVE
Protein, ur: NEGATIVE mg/dL
Specific Gravity, Urine: 1.025 (ref 1.005–1.030)
pH: 5 (ref 5.0–8.0)

## 2016-02-06 LAB — COMPREHENSIVE METABOLIC PANEL
ALBUMIN: 3.2 g/dL — AB (ref 3.5–5.0)
ALK PHOS: 143 U/L — AB (ref 38–126)
ALT: 12 U/L — AB (ref 14–54)
ANION GAP: 5 (ref 5–15)
AST: 14 U/L — ABNORMAL LOW (ref 15–41)
BILIRUBIN TOTAL: 0.3 mg/dL (ref 0.3–1.2)
BUN: 10 mg/dL (ref 6–20)
CALCIUM: 8.9 mg/dL (ref 8.9–10.3)
CO2: 25 mmol/L (ref 22–32)
CREATININE: 0.58 mg/dL (ref 0.44–1.00)
Chloride: 106 mmol/L (ref 101–111)
GFR calc Af Amer: >60 mL/min (ref >60)
GFR calc non Af Amer: >60 mL/min (ref >60)
GLUCOSE: 119 mg/dL — AB (ref 65–99)
Potassium: 4.1 mmol/L (ref 3.5–5.1)
SODIUM: 136 mmol/L (ref 135–145)
TOTAL PROTEIN: 7.8 g/dL (ref 6.5–8.1)

## 2016-02-06 LAB — CBC
HCT: 29.7 % — ABNORMAL LOW (ref 35.0–47.0)
Hemoglobin: 9.8 g/dL — ABNORMAL LOW (ref 12.0–16.0)
MCH: 28.4 pg (ref 26.0–34.0)
MCHC: 33.1 g/dL (ref 32.0–36.0)
MCV: 85.8 fL (ref 80.0–100.0)
PLATELETS: 350 10*3/uL (ref 150–440)
RBC: 3.47 MIL/uL — ABNORMAL LOW (ref 3.80–5.20)
RDW: 12.9 % (ref 11.5–14.5)
WBC: 11.7 10*3/uL — ABNORMAL HIGH (ref 3.6–11.0)

## 2016-02-06 LAB — LIPASE, BLOOD: LIPASE: 12 U/L (ref 11–51)

## 2016-02-06 MED ORDER — IOHEXOL 300 MG/ML  SOLN
75.0000 mL | Freq: Once | INTRAMUSCULAR | Status: AC | PRN
  Administered 2016-02-06: 75 mL via INTRAVENOUS

## 2016-02-06 MED ORDER — MORPHINE SULFATE (PF) 4 MG/ML IV SOLN
4.0000 mg | Freq: Once | INTRAVENOUS | Status: AC
  Administered 2016-02-06: 4 mg via INTRAVENOUS
  Filled 2016-02-06: qty 1

## 2016-02-06 MED ORDER — IOHEXOL 240 MG/ML SOLN
25.0000 mL | INTRAMUSCULAR | Status: AC
  Administered 2016-02-06: 25 mL via ORAL

## 2016-02-06 MED ORDER — ONDANSETRON HCL 4 MG/2ML IJ SOLN
4.0000 mg | Freq: Once | INTRAMUSCULAR | Status: AC
  Administered 2016-02-06: 4 mg via INTRAVENOUS
  Filled 2016-02-06: qty 2

## 2016-02-06 NOTE — ED Notes (Signed)
EM[facility]. Report [facility] to [facility].
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
## 2016-02-06 NOTE — ED Notes (Signed)
Unable to obtain signature. [facility] by [facility].

## 2016-02-09 DIAGNOSIS — K651 Peritoneal abscess: Secondary | ICD-10-CM | POA: Insufficient documentation

## 2016-07-03 ENCOUNTER — Emergency Department: Payer: Medicare Other

## 2016-07-03 ENCOUNTER — Encounter: Payer: Self-pay | Admitting: Emergency Medicine

## 2016-07-03 ENCOUNTER — Emergency Department
Admission: EM | Admit: 2016-07-03 | Discharge: 2016-07-03 | Disposition: A | Discharge status: Home / Self Care | Payer: Medicare Other | Attending: Student | Admitting: Student

## 2016-07-03 DIAGNOSIS — R300 Dysuria: Secondary | ICD-10-CM | POA: Diagnosis not present

## 2016-07-03 DIAGNOSIS — R1031 Right lower quadrant pain: Secondary | ICD-10-CM | POA: Insufficient documentation

## 2016-07-03 DIAGNOSIS — Z79899 Other long term (current) drug therapy: Secondary | ICD-10-CM | POA: Insufficient documentation

## 2016-07-03 DIAGNOSIS — R109 Unspecified abdominal pain: Secondary | ICD-10-CM

## 2016-07-03 LAB — URINALYSIS COMPLETE WITH MICROSCOPIC (ARMC ONLY)
BACTERIA UA: NONE SEEN
BILIRUBIN URINE: NEGATIVE
GLUCOSE, UA: NEGATIVE mg/dL
HGB URINE DIPSTICK: NEGATIVE
Ketones, ur: NEGATIVE mg/dL
Nitrite: NEGATIVE
Protein, ur: NEGATIVE mg/dL
SPECIFIC GRAVITY, URINE: 1.012 (ref 1.005–1.030)
pH: 7 (ref 5.0–8.0)

## 2016-07-03 LAB — COMPREHENSIVE METABOLIC PANEL
ALBUMIN: 3.7 g/dL (ref 3.5–5.0)
ALK PHOS: 62 U/L (ref 38–126)
ALT: 10 U/L — ABNORMAL LOW (ref 14–54)
ANION GAP: 4 — AB (ref 5–15)
AST: 15 U/L (ref 15–41)
BUN: 9 mg/dL (ref 6–20)
CALCIUM: 8.7 mg/dL — AB (ref 8.9–10.3)
CHLORIDE: 111 mmol/L (ref 101–111)
CO2: 25 mmol/L (ref 22–32)
Creatinine, Ser: 0.66 mg/dL (ref 0.44–1.00)
GFR calc non Af Amer: >60 mL/min (ref >60)
GLUCOSE: 90 mg/dL (ref 65–99)
POTASSIUM: 3.8 mmol/L (ref 3.5–5.1)
SODIUM: 140 mmol/L (ref 135–145)
TOTAL PROTEIN: 6.1 g/dL — AB (ref 6.5–8.1)
Total Bilirubin: 0.6 mg/dL (ref 0.3–1.2)

## 2016-07-03 LAB — CBC
HEMATOCRIT: 34.9 % — AB (ref 35.0–47.0)
Hemoglobin: 11.9 g/dL — ABNORMAL LOW (ref 12.0–16.0)
MCH: 29.9 pg (ref 26.0–34.0)
MCHC: 34.2 g/dL (ref 32.0–36.0)
MCV: 87.3 fL (ref 80.0–100.0)
PLATELETS: 206 10*3/uL (ref 150–440)
RBC: 4 MIL/uL (ref 3.80–5.20)
RDW: 13.3 % (ref 11.5–14.5)
WBC: 7.9 10*3/uL (ref 3.6–11.0)

## 2016-07-03 LAB — POCT PREGNANCY, URINE: Preg Test, Ur: NEGATIVE

## 2016-07-03 LAB — LIPASE, BLOOD: LIPASE: 23 U/L (ref 11–51)

## 2016-07-03 MED ORDER — DIATRIZOATE MEGLUMINE & SODIUM 66-10 % PO SOLN
15.0000 mL | ORAL | Status: AC
  Administered 2016-07-03: 15 mL via ORAL

## 2016-07-03 MED ORDER — KETOROLAC TROMETHAMINE 30 MG/ML IJ SOLN
15.0000 mg | Freq: Once | INTRAMUSCULAR | Status: AC
  Administered 2016-07-03: 15 mg via INTRAVENOUS
  Filled 2016-07-03: qty 1

## 2016-07-03 MED ORDER — ONDANSETRON HCL 4 MG/2ML IJ SOLN
4.0000 mg | Freq: Once | INTRAMUSCULAR | Status: AC
  Administered 2016-07-03: 4 mg via INTRAVENOUS
  Filled 2016-07-03: qty 2

## 2016-07-03 MED ORDER — SODIUM CHLORIDE 0.9 % IV BOLUS (SEPSIS)
1000.0000 mL | Freq: Once | INTRAVENOUS | Status: AC
  Administered 2016-07-03: 1000 mL via INTRAVENOUS

## 2016-07-03 MED ORDER — IOPAMIDOL (ISOVUE-300) INJECTION 61%
80.0000 mL | Freq: Once | INTRAVENOUS | Status: AC | PRN
  Administered 2016-07-03: 80 mL via INTRAVENOUS
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
## 2016-07-03 NOTE — ED Notes (Signed)
Lab notified to cancel urine pregnancy test as there was a POC preg completed in the ED.

## 2016-07-03 NOTE — ED Notes (Signed)
Patient transported to CT 
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
## 2016-07-05 LAB — URINE CULTURE: CULTURE: NO GROWTH

## 2016-07-11 ENCOUNTER — Encounter: Payer: Self-pay | Admitting: Medical Oncology

## 2016-07-11 ENCOUNTER — Emergency Department
Admission: EM | Admit: 2016-07-11 | Discharge: 2016-07-11 | Disposition: A | Discharge status: Home / Self Care | Payer: Medicare Other | Attending: Emergency Medicine | Admitting: Emergency Medicine

## 2016-07-11 ENCOUNTER — Emergency Department: Payer: Medicare Other

## 2016-07-11 DIAGNOSIS — R1032 Left lower quadrant pain: Secondary | ICD-10-CM | POA: Diagnosis present

## 2016-07-11 DIAGNOSIS — K5900 Constipation, unspecified: Secondary | ICD-10-CM | POA: Insufficient documentation

## 2016-07-11 LAB — COMPREHENSIVE METABOLIC PANEL
ALT: 13 U/L — ABNORMAL LOW (ref 14–54)
AST: 17 U/L (ref 15–41)
Albumin: 4.2 g/dL (ref 3.5–5.0)
Alkaline Phosphatase: 66 U/L (ref 38–126)
Anion gap: 6 (ref 5–15)
BUN: 15 mg/dL (ref 6–20)
CALCIUM: 9.4 mg/dL (ref 8.9–10.3)
CO2: 25 mmol/L (ref 22–32)
CREATININE: 0.58 mg/dL (ref 0.44–1.00)
Chloride: 107 mmol/L (ref 101–111)
GFR calc Af Amer: >60 mL/min (ref >60)
GFR calc non Af Amer: >60 mL/min (ref >60)
GLUCOSE: 92 mg/dL (ref 65–99)
POTASSIUM: 4.2 mmol/L (ref 3.5–5.1)
SODIUM: 138 mmol/L (ref 135–145)
TOTAL PROTEIN: 6.5 g/dL (ref 6.5–8.1)
Total Bilirubin: 0.3 mg/dL (ref 0.3–1.2)

## 2016-07-11 LAB — URINALYSIS COMPLETE WITH MICROSCOPIC (ARMC ONLY)
BACTERIA UA: NONE SEEN
BILIRUBIN URINE: NEGATIVE
Glucose, UA: NEGATIVE mg/dL
Ketones, ur: NEGATIVE mg/dL
Nitrite: NEGATIVE
PH: 6 (ref 5.0–8.0)
PROTEIN: NEGATIVE mg/dL
Specific Gravity, Urine: 1.006 (ref 1.005–1.030)

## 2016-07-11 LAB — CBC
HEMATOCRIT: 37.7 % (ref 35.0–47.0)
Hemoglobin: 12.9 g/dL (ref 12.0–16.0)
MCH: 29.8 pg (ref 26.0–34.0)
MCHC: 34.2 g/dL (ref 32.0–36.0)
MCV: 87.4 fL (ref 80.0–100.0)
Platelets: 228 10*3/uL (ref 150–440)
RBC: 4.32 MIL/uL (ref 3.80–5.20)
RDW: 13.1 % (ref 11.5–14.5)
WBC: 9 10*3/uL (ref 3.6–11.0)

## 2016-07-11 LAB — LIPASE, BLOOD: LIPASE: 20 U/L (ref 11–51)

## 2016-07-11 LAB — PREGNANCY, URINE: Preg Test, Ur: NEGATIVE

## 2016-07-11 MED ORDER — DICYCLOMINE HCL 20 MG PO TABS: 20.0000 mg | ORAL_TABLET | Freq: Three times a day (TID) | ORAL | 0 refills | Status: DC | PRN

## 2016-07-11 MED ORDER — POLYETHYLENE GLYCOL 3350 17 G PO PACK: 17.0000 g | PACK | Freq: Every day | ORAL | 0 refills | Status: DC

## 2016-07-11 NOTE — Discharge Instructions (Signed)
Return to emergency department especially for bloody stool, fever, increasing abdominal pain, persistent vomiting, or any other new concerns  Please return immediately if condition worsens. Please contact her primary physician or the physician you were given for referral. If you have any specialist physicians involved in her treatment and plan please also contact them. Thank you for using [place] [facility] emergency [facility].

## 2016-07-11 NOTE — ED Notes (Signed)
Pt discharged to home.  Family member driving.  Discharge instructions reviewed.  Verbalized understanding.  No questions or concerns at this time.  Teach back verified.  Pt in NAD.  No items left in ED.   

## 2016-07-11 NOTE — ED Triage Notes (Signed)
Pt reports left lower abd pain with radiation of pain into left flank that began 2 days ago. Pt denies nvd, denies dysuria.
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
## 2016-07-11 NOTE — ED Notes (Signed)
Patient transported to X-ray 

## 2016-07-11 NOTE — ED Notes (Signed)
Patient returned from radiology

## 2016-10-03 DIAGNOSIS — Z4689 Encounter for fitting and adjustment of other specified devices: Secondary | ICD-10-CM | POA: Insufficient documentation

## 2016-10-03 DIAGNOSIS — W3400XA Accidental discharge from unspecified firearms or gun, initial encounter: Secondary | ICD-10-CM | POA: Insufficient documentation

## 2017-04-14 ENCOUNTER — Emergency Department
Admission: EM | Admit: 2017-04-14 | Discharge: 2017-04-14 | Disposition: A | Discharge status: Home / Self Care | Payer: Medicare Other | Attending: Emergency Medicine | Admitting: Emergency Medicine

## 2017-04-14 ENCOUNTER — Encounter: Payer: Self-pay | Admitting: Emergency Medicine

## 2017-04-14 ENCOUNTER — Emergency Department: Payer: Medicare Other

## 2017-04-14 DIAGNOSIS — M25519 Pain in unspecified shoulder: Secondary | ICD-10-CM | POA: Diagnosis present

## 2017-04-14 DIAGNOSIS — M5412 Radiculopathy, cervical region: Secondary | ICD-10-CM | POA: Insufficient documentation

## 2017-04-14 DIAGNOSIS — Z79899 Other long term (current) drug therapy: Secondary | ICD-10-CM | POA: Insufficient documentation

## 2017-04-14 MED ORDER — METHOCARBAMOL 750 MG PO TABS: 750.0000 mg | ORAL_TABLET | Freq: Four times a day (QID) | ORAL | 0 refills | Status: DC

## 2017-04-14 MED ORDER — TRAMADOL HCL 50 MG PO TABS: 50.0000 mg | ORAL_TABLET | Freq: Two times a day (BID) | ORAL | 0 refills | Status: DC | PRN

## 2017-04-14 MED ORDER — METHYLPREDNISOLONE 4 MG PO TBPK: ORAL_TABLET | ORAL | 0 refills | Status: DC
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
## 2017-04-14 NOTE — ED Triage Notes (Signed)
C/O upper back and left shoulder pain x 1 year.  Has been taking Alleve and Advil with no effect.

## 2017-04-21 ENCOUNTER — Emergency Department
Admission: EM | Admit: 2017-04-21 | Discharge: 2017-04-21 | Disposition: A | Discharge status: Home / Self Care | Payer: Medicare Other | Attending: Emergency Medicine | Admitting: Emergency Medicine

## 2017-04-21 ENCOUNTER — Encounter: Payer: Self-pay | Admitting: Emergency Medicine

## 2017-04-21 DIAGNOSIS — Z79899 Other long term (current) drug therapy: Secondary | ICD-10-CM | POA: Diagnosis not present

## 2017-04-21 DIAGNOSIS — J029 Acute pharyngitis, unspecified: Secondary | ICD-10-CM | POA: Insufficient documentation

## 2017-04-21 MED ORDER — MAGIC MOUTHWASH W/LIDOCAINE: 10.0000 mL | Freq: Four times a day (QID) | ORAL | 0 refills | Status: DC | PRN

## 2017-04-21 MED ORDER — AMOXICILLIN 500 MG PO TABS: 500.0000 mg | ORAL_TABLET | Freq: Three times a day (TID) | ORAL | 0 refills | Status: DC
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
## 2017-04-21 NOTE — ED Notes (Signed)
POC STREP NEGATIVE 

## 2017-04-21 NOTE — ED Triage Notes (Signed)
Pt c/o sore throat x 3 days. Denies fever.  

## 2017-04-22 ENCOUNTER — Encounter: Payer: Self-pay | Admitting: Radiology

## 2017-04-22 ENCOUNTER — Emergency Department: Payer: Medicare Other

## 2017-04-22 ENCOUNTER — Emergency Department
Admission: EM | Admit: 2017-04-22 | Discharge: 2017-04-22 | Disposition: A | Discharge status: Home / Self Care | Payer: Medicare Other | Attending: Emergency Medicine | Admitting: Emergency Medicine

## 2017-04-22 DIAGNOSIS — M542 Cervicalgia: Secondary | ICD-10-CM | POA: Insufficient documentation

## 2017-04-22 DIAGNOSIS — J029 Acute pharyngitis, unspecified: Secondary | ICD-10-CM | POA: Diagnosis not present

## 2017-04-22 DIAGNOSIS — R1310 Dysphagia, unspecified: Secondary | ICD-10-CM | POA: Diagnosis present

## 2017-04-22 LAB — CBC WITH DIFFERENTIAL/PLATELET
BASOS PCT: 0 %
Basophils Absolute: 0 10*3/uL (ref 0–0.1)
EOS ABS: 0.1 10*3/uL (ref 0–0.7)
Eosinophils Relative: 1 %
HEMATOCRIT: 37 % (ref 35.0–47.0)
HEMOGLOBIN: 12.4 g/dL (ref 12.0–16.0)
LYMPHS ABS: 1.7 10*3/uL (ref 1.0–3.6)
Lymphocytes Relative: 17 %
MCH: 28.2 pg (ref 26.0–34.0)
MCHC: 33.5 g/dL (ref 32.0–36.0)
MCV: 84.1 fL (ref 80.0–100.0)
MONOS PCT: 7 %
Monocytes Absolute: 0.7 10*3/uL (ref 0.2–0.9)
NEUTROS ABS: 7.5 10*3/uL — AB (ref 1.4–6.5)
NEUTROS PCT: 75 %
Platelets: 230 10*3/uL (ref 150–440)
RBC: 4.4 MIL/uL (ref 3.80–5.20)
RDW: 13.4 % (ref 11.5–14.5)
WBC: 10 10*3/uL (ref 3.6–11.0)

## 2017-04-22 LAB — BASIC METABOLIC PANEL
Anion gap: 9 (ref 5–15)
BUN: 9 mg/dL (ref 6–20)
CHLORIDE: 106 mmol/L (ref 101–111)
CO2: 24 mmol/L (ref 22–32)
Calcium: 9.2 mg/dL (ref 8.9–10.3)
Creatinine, Ser: 0.55 mg/dL (ref 0.44–1.00)
GFR calc non Af Amer: >60 mL/min (ref >60)
Glucose, Bld: 103 mg/dL — ABNORMAL HIGH (ref 65–99)
POTASSIUM: 3.8 mmol/L (ref 3.5–5.1)
SODIUM: 139 mmol/L (ref 135–145)

## 2017-04-22 MED ORDER — DEXAMETHASONE SODIUM PHOSPHATE 10 MG/ML IJ SOLN
10.0000 mg | Freq: Once | INTRAMUSCULAR | Status: AC
  Administered 2017-04-22: 10 mg via INTRAVENOUS
  Filled 2017-04-22: qty 1

## 2017-04-22 MED ORDER — GI COCKTAIL ~~LOC~~
ORAL | Status: AC
  Administered 2017-04-22: 30 mL via ORAL
  Filled 2017-04-22: qty 30

## 2017-04-22 MED ORDER — IOPAMIDOL (ISOVUE-300) INJECTION 61%
75.0000 mL | Freq: Once | INTRAVENOUS | Status: AC | PRN
  Administered 2017-04-22: 75 mL via INTRAVENOUS
  Filled 2017-04-22: qty 75

## 2017-04-22 MED ORDER — GI COCKTAIL ~~LOC~~
30.0000 mL | Freq: Once | ORAL | Status: AC
  Administered 2017-04-22: 30 mL via ORAL

## 2017-04-22 MED ORDER — ONDANSETRON HCL 4 MG/2ML IJ SOLN
4.0000 mg | Freq: Once | INTRAMUSCULAR | Status: AC
  Administered 2017-04-22: 4 mg via INTRAVENOUS
  Filled 2017-04-22: qty 2

## 2017-04-22 NOTE — ED Notes (Signed)
Ambulatory, alert, oriented. States was seen for sore throat recently. States "i couldn't breath" last night. States pain worse. Coughing. No distress noted.

## 2017-04-22 NOTE — ED Notes (Signed)
Pt asking about when they will get her for CT. Pt has been re-updated on process

## 2017-04-22 NOTE — ED Notes (Signed)
Pt updated on ct scan process. Pt verbalizes understanding.  
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
## 2017-04-22 NOTE — ED Triage Notes (Signed)
Pt seen here yesterday for sore throat and given antibiotics. Pt states was told to come back if pain worse. Pt crying in triage. Pt speaking in complete sentences without difficulty. No airway obstruction noted.

## 2017-04-22 NOTE — ED Notes (Signed)
Pt provided with warm blanket. 

## 2017-04-23 LAB — CULTURE, GROUP A STREP (THRC)

## 2017-04-24 ENCOUNTER — Encounter (HOSPITAL_COMMUNITY): Payer: Self-pay | Admitting: Emergency Medicine

## 2017-04-24 ENCOUNTER — Emergency Department (HOSPITAL_COMMUNITY)
Admission: EM | Admit: 2017-04-24 | Discharge: 2017-04-24 | Disposition: A | Discharge status: Home / Self Care | Payer: Medicare Other | Attending: Emergency Medicine | Admitting: Emergency Medicine

## 2017-04-24 DIAGNOSIS — J029 Acute pharyngitis, unspecified: Secondary | ICD-10-CM | POA: Diagnosis present

## 2017-04-24 MED ORDER — HYDROCODONE-ACETAMINOPHEN 7.5-325 MG/15ML PO SOLN: 10.0000 mL | Freq: Four times a day (QID) | ORAL | 0 refills | Status: AC | PRN

## 2017-04-24 MED ORDER — PREDNISOLONE 15 MG/5ML PO SOLN: 30.0000 mg | Freq: Every day | ORAL | 0 refills | Status: AC

## 2017-04-24 NOTE — ED Triage Notes (Signed)
Pt reports that her "sore throat" continues w/ no relief despite taking antibiotics.  Pt reports painful to swallow or talk, pt is able to speak in complete sentences w/o any issues and breathing appears WNL.
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
## 2017-04-24 NOTE — Discharge Instructions (Signed)
You were seen in the ED today with sore throat. The CT scan from 2 days ago was negative along with the strep throat culture. We are discharging you home with pain medication and 4 more days of steroid. Follow up with your PCP and return to the ED with any new or worsening symptoms.

## 2017-05-20 ENCOUNTER — Emergency Department (HOSPITAL_COMMUNITY)
Admission: EM | Admit: 2017-05-20 | Discharge: 2017-05-20 | Disposition: A | Discharge status: Home / Self Care | Payer: Medicare Other | Attending: Emergency Medicine | Admitting: Emergency Medicine

## 2017-05-20 ENCOUNTER — Encounter (HOSPITAL_COMMUNITY): Payer: Self-pay | Admitting: Emergency Medicine

## 2017-05-20 ENCOUNTER — Emergency Department (HOSPITAL_COMMUNITY): Payer: Medicare Other

## 2017-05-20 DIAGNOSIS — R1013 Epigastric pain: Secondary | ICD-10-CM

## 2017-05-20 DIAGNOSIS — R112 Nausea with vomiting, unspecified: Secondary | ICD-10-CM | POA: Diagnosis not present

## 2017-05-20 LAB — COMPREHENSIVE METABOLIC PANEL
ALK PHOS: 90 U/L (ref 38–126)
ALT: 13 U/L — AB (ref 14–54)
ANION GAP: 8 (ref 5–15)
AST: 19 U/L (ref 15–41)
Albumin: 4.3 g/dL (ref 3.5–5.0)
BUN: 16 mg/dL (ref 6–20)
CALCIUM: 9.3 mg/dL (ref 8.9–10.3)
CO2: 24 mmol/L (ref 22–32)
CREATININE: 0.54 mg/dL (ref 0.44–1.00)
Chloride: 107 mmol/L (ref 101–111)
Glucose, Bld: 94 mg/dL (ref 65–99)
Potassium: 3.8 mmol/L (ref 3.5–5.1)
SODIUM: 139 mmol/L (ref 135–145)
Total Bilirubin: 0.8 mg/dL (ref 0.3–1.2)
Total Protein: 7 g/dL (ref 6.5–8.1)

## 2017-05-20 LAB — CBC
HCT: 38.2 % (ref 36.0–46.0)
HEMOGLOBIN: 12.7 g/dL (ref 12.0–15.0)
MCH: 27.6 pg (ref 26.0–34.0)
MCHC: 33.2 g/dL (ref 30.0–36.0)
MCV: 83 fL (ref 78.0–100.0)
PLATELETS: 196 10*3/uL (ref 150–400)
RBC: 4.6 MIL/uL (ref 3.87–5.11)
RDW: 12.8 % (ref 11.5–15.5)
WBC: 8.5 10*3/uL (ref 4.0–10.5)

## 2017-05-20 LAB — URINALYSIS, ROUTINE W REFLEX MICROSCOPIC
BILIRUBIN URINE: NEGATIVE
Bacteria, UA: NONE SEEN
GLUCOSE, UA: NEGATIVE mg/dL
KETONES UR: 80 mg/dL — AB
LEUKOCYTES UA: NEGATIVE
NITRITE: NEGATIVE
PH: 5 (ref 5.0–8.0)
PROTEIN: 30 mg/dL — AB
Specific Gravity, Urine: 1.03 (ref 1.005–1.030)

## 2017-05-20 LAB — DIFFERENTIAL
BASOS PCT: 0 %
Basophils Absolute: 0 10*3/uL (ref 0.0–0.1)
EOS ABS: 0 10*3/uL (ref 0.0–0.7)
EOS PCT: 0 %
LYMPHS ABS: 0.9 10*3/uL (ref 0.7–4.0)
Lymphocytes Relative: 11 %
MONO ABS: 0.6 10*3/uL (ref 0.1–1.0)
MONOS PCT: 7 %
Neutro Abs: 6.7 10*3/uL (ref 1.7–7.7)
Neutrophils Relative %: 82 %

## 2017-05-20 LAB — LIPASE, BLOOD: LIPASE: 19 U/L (ref 11–51)

## 2017-05-20 LAB — PREGNANCY, URINE: Preg Test, Ur: NEGATIVE

## 2017-05-20 MED ORDER — FAMOTIDINE IN NACL 20-0.9 MG/50ML-% IV SOLN
20.0000 mg | Freq: Once | INTRAVENOUS | Status: AC
  Administered 2017-05-20: 20 mg via INTRAVENOUS
  Filled 2017-05-20: qty 50

## 2017-05-20 MED ORDER — ONDANSETRON HCL 4 MG/2ML IJ SOLN
4.0000 mg | INTRAMUSCULAR | Status: DC | PRN
  Administered 2017-05-20: 4 mg via INTRAVENOUS
  Filled 2017-05-20: qty 2

## 2017-05-20 MED ORDER — FAMOTIDINE 20 MG PO TABS: 20.0000 mg | ORAL_TABLET | Freq: Two times a day (BID) | ORAL | 0 refills | Status: DC

## 2017-05-20 MED ORDER — ONDANSETRON 4 MG PO TBDP: 4.0000 mg | ORAL_TABLET | Freq: Three times a day (TID) | ORAL | 0 refills | Status: DC | PRN

## 2017-05-20 MED ORDER — DICYCLOMINE HCL 10 MG/ML IM SOLN
20.0000 mg | Freq: Once | INTRAMUSCULAR | Status: AC
  Administered 2017-05-20: 20 mg via INTRAMUSCULAR
  Filled 2017-05-20: qty 2
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
## 2017-05-20 NOTE — ED Notes (Signed)
Pt reports pain around her umbilicus for several days- was at work and the longer she stood the more she felt that she would fall She reports that she felt that she could not work today

## 2017-05-20 NOTE — Discharge Instructions (Signed)
Take the prescriptions as directed. Increase your fluid intake (ie:  Gatoraide) for the next few days.  Eat a bland diet and advance to your regular diet slowly as you can tolerate it. Eat a bland diet, avoiding greasy, fatty, fried foods, as well as spicy and acidic foods or beverages.  Avoid eating within the hour or 2 before going to bed or laying down.  Also avoid teas, colas, coffee, chocolate, pepermint and spearment.   May also take over the counter maalox/mylanta, as directed on packaging, as needed for discomfort.  Call your regular medical doctor on Monday to schedule a follow up appointment in the next 2 days.  Return to the Emergency Department immediately if worsening.

## 2017-05-20 NOTE — ED Notes (Signed)
Pt reports "feeling much better".

## 2017-05-20 NOTE — ED Notes (Signed)
Pt has had no vomiting since arrival to department

## 2017-05-20 NOTE — ED Triage Notes (Addendum)
PT c/o middle abdominal discomfort with nausea and vomiting x2 since yesterday. PT also states she started her menstrual cycle yesterday and had normal BM 05/19/17 and denies any urinary symptoms.

## 2017-06-19 ENCOUNTER — Emergency Department
Admission: EM | Admit: 2017-06-19 | Discharge: 2017-06-19 | Disposition: A | Discharge status: Home / Self Care | Payer: Medicare Other | Attending: Emergency Medicine | Admitting: Emergency Medicine

## 2017-06-19 ENCOUNTER — Encounter: Payer: Self-pay | Admitting: Emergency Medicine

## 2017-06-19 ENCOUNTER — Emergency Department: Payer: Medicare Other

## 2017-06-19 DIAGNOSIS — R55 Syncope and collapse: Secondary | ICD-10-CM

## 2017-06-19 DIAGNOSIS — H538 Other visual disturbances: Secondary | ICD-10-CM | POA: Insufficient documentation

## 2017-06-19 DIAGNOSIS — R42 Dizziness and giddiness: Secondary | ICD-10-CM | POA: Diagnosis present

## 2017-06-19 LAB — HEPATIC FUNCTION PANEL
ALBUMIN: 4.2 g/dL (ref 3.5–5.0)
ALK PHOS: 80 U/L (ref 38–126)
ALT: 13 U/L — ABNORMAL LOW (ref 14–54)
AST: 19 U/L (ref 15–41)
Bilirubin, Direct: <0.1 mg/dL — ABNORMAL LOW (ref 0.1–0.5)
TOTAL PROTEIN: 6.8 g/dL (ref 6.5–8.1)
Total Bilirubin: 0.4 mg/dL (ref 0.3–1.2)

## 2017-06-19 LAB — URINALYSIS, COMPLETE (UACMP) WITH MICROSCOPIC
Bilirubin Urine: NEGATIVE
Glucose, UA: NEGATIVE mg/dL
KETONES UR: NEGATIVE mg/dL
Nitrite: NEGATIVE
PH: 5 (ref 5.0–8.0)
PROTEIN: NEGATIVE mg/dL
Specific Gravity, Urine: 1.03 (ref 1.005–1.030)

## 2017-06-19 LAB — TROPONIN I

## 2017-06-19 LAB — CBC
HCT: 37.5 % (ref 35.0–47.0)
Hemoglobin: 12.7 g/dL (ref 12.0–16.0)
MCH: 27.2 pg (ref 26.0–34.0)
MCHC: 33.8 g/dL (ref 32.0–36.0)
MCV: 80.3 fL (ref 80.0–100.0)
PLATELETS: 212 10*3/uL (ref 150–440)
RBC: 4.68 MIL/uL (ref 3.80–5.20)
RDW: 13.7 % (ref 11.5–14.5)
WBC: 7.3 10*3/uL (ref 3.6–11.0)

## 2017-06-19 LAB — POCT PREGNANCY, URINE: Preg Test, Ur: NEGATIVE

## 2017-06-19 LAB — BASIC METABOLIC PANEL
ANION GAP: 7 (ref 5–15)
BUN: 16 mg/dL (ref 6–20)
CO2: 25 mmol/L (ref 22–32)
Calcium: 9.6 mg/dL (ref 8.9–10.3)
Chloride: 105 mmol/L (ref 101–111)
Creatinine, Ser: 0.72 mg/dL (ref 0.44–1.00)
Glucose, Bld: 95 mg/dL (ref 65–99)
POTASSIUM: 3.9 mmol/L (ref 3.5–5.1)
Sodium: 137 mmol/L (ref 135–145)

## 2017-06-19 MED ORDER — ONDANSETRON 4 MG PO TBDP
ORAL_TABLET | ORAL | Status: AC
  Filled 2017-06-19: qty 1

## 2017-06-19 MED ORDER — ONDANSETRON 4 MG PO TBDP
4.0000 mg | ORAL_TABLET | Freq: Once | ORAL | Status: AC
  Administered 2017-06-19: 4 mg via ORAL

## 2017-06-19 NOTE — ED Notes (Signed)
This RN to bedside at this time to introduce self to patient. Pt is alert and oriented. Pt requests to be unhooked to go to the bathroom. Pt denies any further needs. Will continue to monitor for further patient needs. Respirations even and unlabored, skin warm, dry, and intact.

## 2017-06-19 NOTE — ED Notes (Signed)
Pt requested medication for nausea. Dr. Darnelle CatalanMalinda notified. Orders received.
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
## 2017-06-19 NOTE — ED Triage Notes (Signed)
Pt c/o multiple episodes of near syncope for past 3-5 days.  Felt like vision got blurry and was going to pass out while watching TV. 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  Had another episode today.  ambulatory to triage with steady gait. VSS

## 2017-06-19 NOTE — ED Notes (Signed)
NAD noted at time of D/C. Pt denies questions or concerns. Pt ambulatory to the lobby at this time.  

## 2017-06-19 NOTE — ED Notes (Signed)
Pt up to the bathroom at this time. No change in patient condition. Pt tolerated well. This RN apologized and explained delay. Pt states understanding. Denies any further needs at this time. Will continue to monitor for further patient needs.

## 2017-06-19 NOTE — Discharge Instructions (Signed)
Please follow-up with your family doctor or [facility] or [facility]. Everything we checked today looks okay am not sure why you're having these episodes. Please return iif they continue.

## 2017-06-27 ENCOUNTER — Emergency Department
Admission: EM | Admit: 2017-06-27 | Discharge: 2017-06-27 | Disposition: A | Discharge status: Home / Self Care | Payer: Medicare Other | Attending: Emergency Medicine | Admitting: Emergency Medicine

## 2017-06-27 ENCOUNTER — Encounter: Payer: Self-pay | Admitting: Emergency Medicine

## 2017-06-27 DIAGNOSIS — N3001 Acute cystitis with hematuria: Secondary | ICD-10-CM | POA: Insufficient documentation

## 2017-06-27 DIAGNOSIS — R300 Dysuria: Secondary | ICD-10-CM | POA: Diagnosis present

## 2017-06-27 LAB — URINALYSIS, COMPLETE (UACMP) WITH MICROSCOPIC
Bacteria, UA: NONE SEEN
Bilirubin Urine: NEGATIVE
GLUCOSE, UA: NEGATIVE mg/dL
Ketones, ur: NEGATIVE mg/dL
NITRITE: NEGATIVE
PROTEIN: 100 mg/dL — AB
SPECIFIC GRAVITY, URINE: 1.029 (ref 1.005–1.030)
pH: 6 (ref 5.0–8.0)

## 2017-06-27 LAB — POCT PREGNANCY, URINE: PREG TEST UR: NEGATIVE

## 2017-06-27 MED ORDER — SULFAMETHOXAZOLE-TRIMETHOPRIM 800-160 MG PO TABS: 1.0000 | ORAL_TABLET | Freq: Two times a day (BID) | ORAL | 0 refills | Status: DC

## 2017-06-27 MED ORDER — PHENAZOPYRIDINE HCL 200 MG PO TABS: 200.0000 mg | ORAL_TABLET | Freq: Three times a day (TID) | ORAL | 0 refills | Status: DC | PRN

## 2017-06-27 NOTE — ED Triage Notes (Signed)
Pt states every since she took her tampon out this am she has had painful urination. Pt is on cycle at this time.

## 2017-06-27 NOTE — ED Notes (Signed)
Pt states she is unable to urinate at this time, pt provided 2 cups of water and asked to let us know when she is able to provide specimen.

## 2017-06-27 NOTE — Discharge Instructions (Signed)
Take all of the antibiotic until finished. Follow-up with the primary care provider of your choice in about 2 weeks for a recheck of the urine to make sure is completely clear. Return to the emergency department for symptoms that change or worsen if you're unable to schedule an appointment.
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
## 2017-11-09 IMAGING — DX DG ABDOMEN ACUTE W/ 1V CHEST
3 series · 3 of 3 positions shown · non-contrast
Comparison: None.

CLINICAL DATA: Upper abdominal pain

EXAM:
DG ABDOMEN ACUTE W/ 1V CHEST

[chest pa]
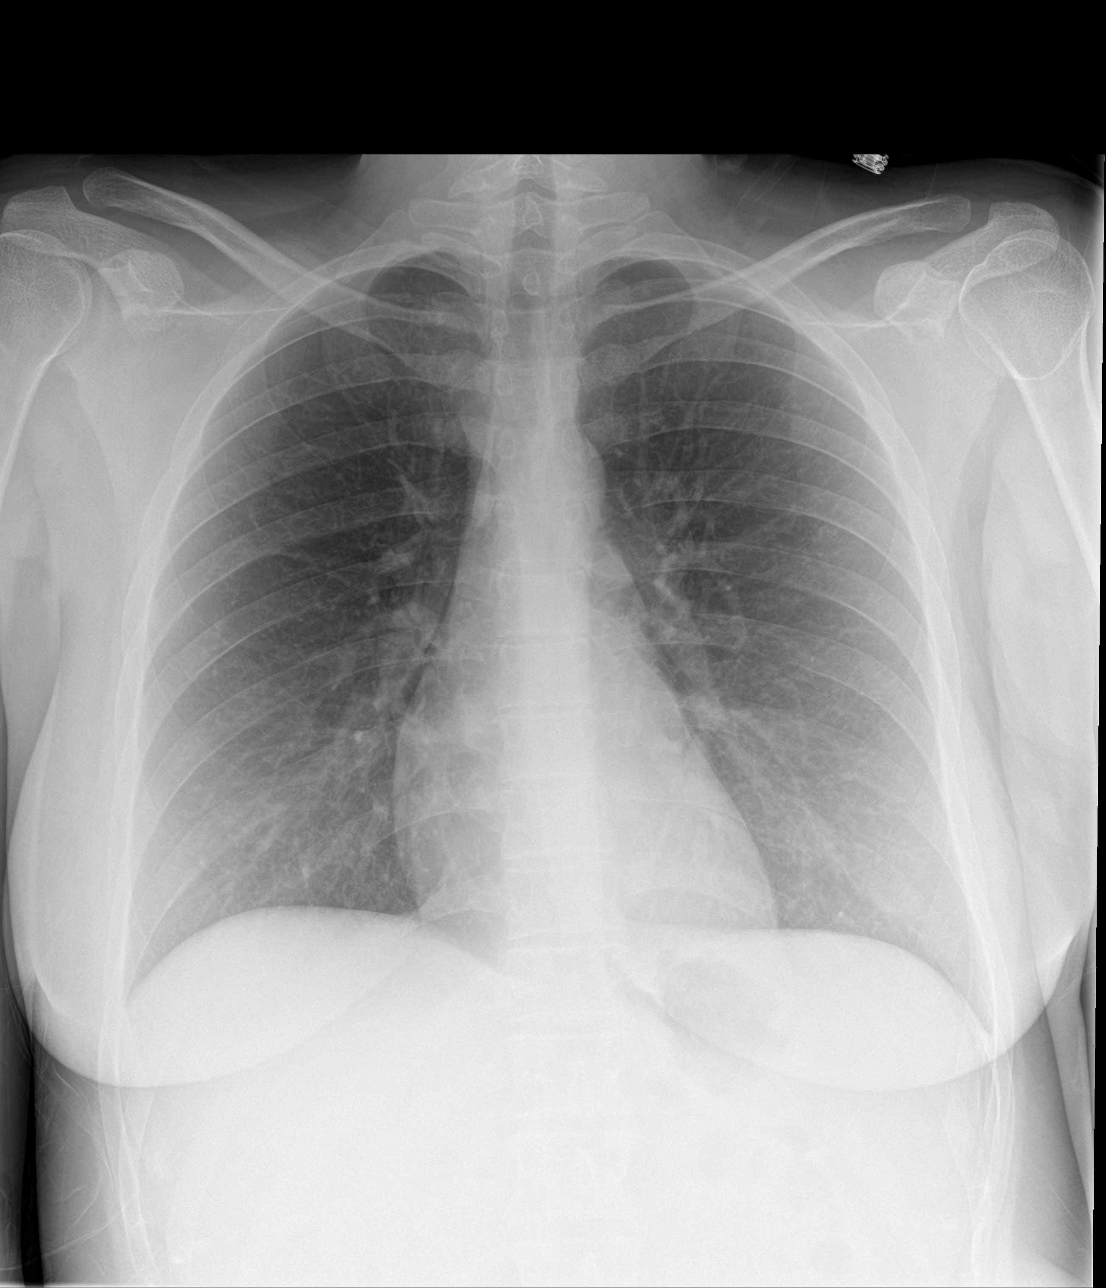

[abdomen erect]
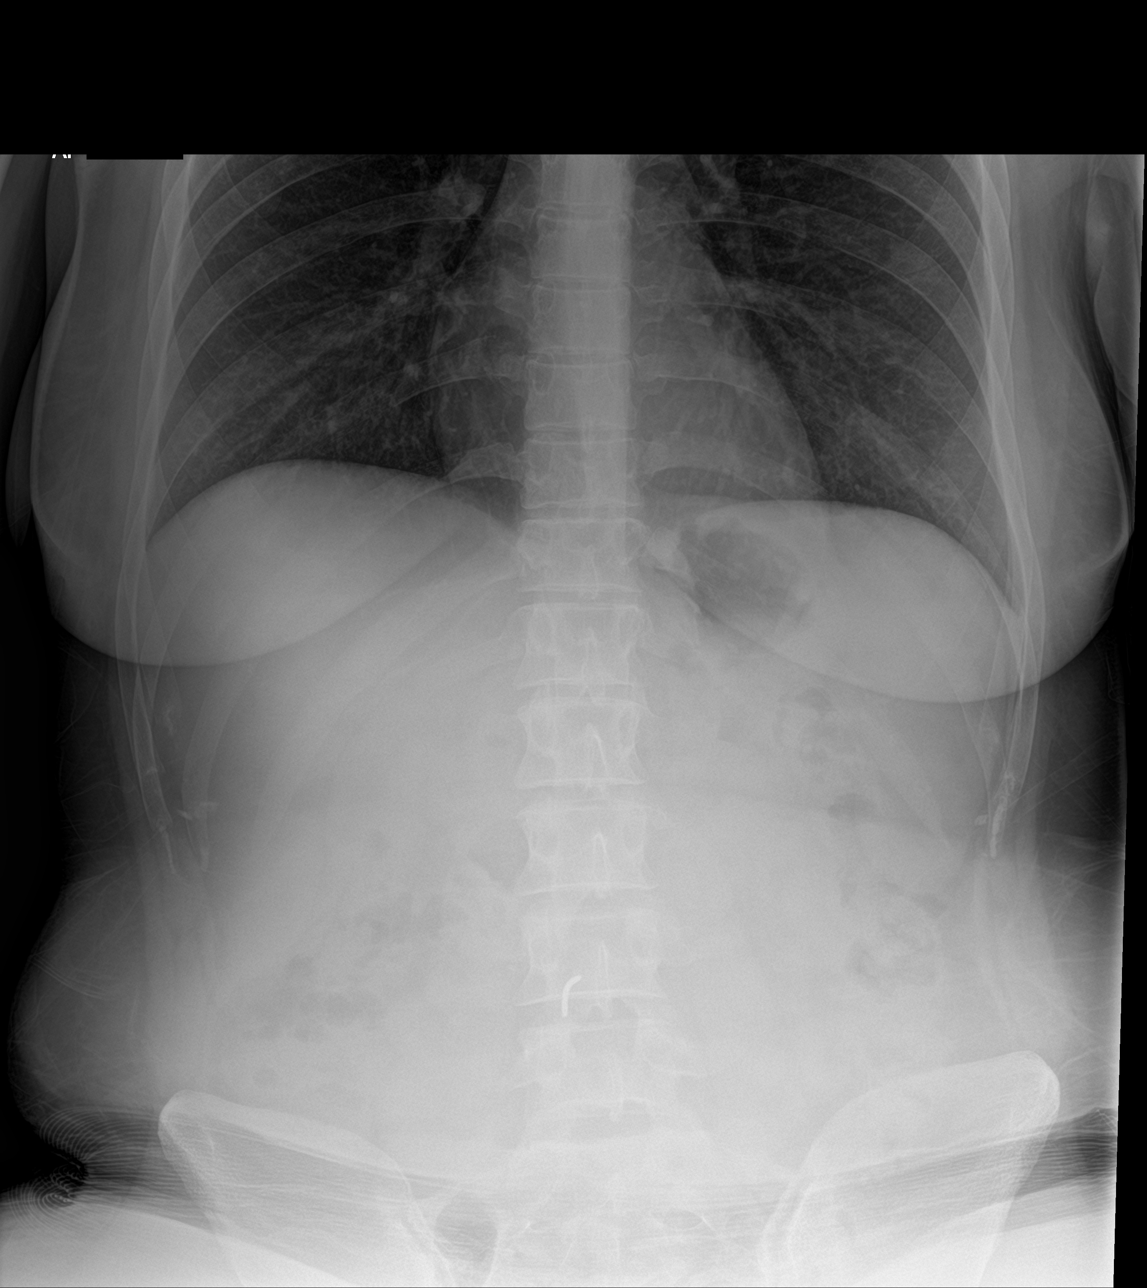

[abdomen supine]
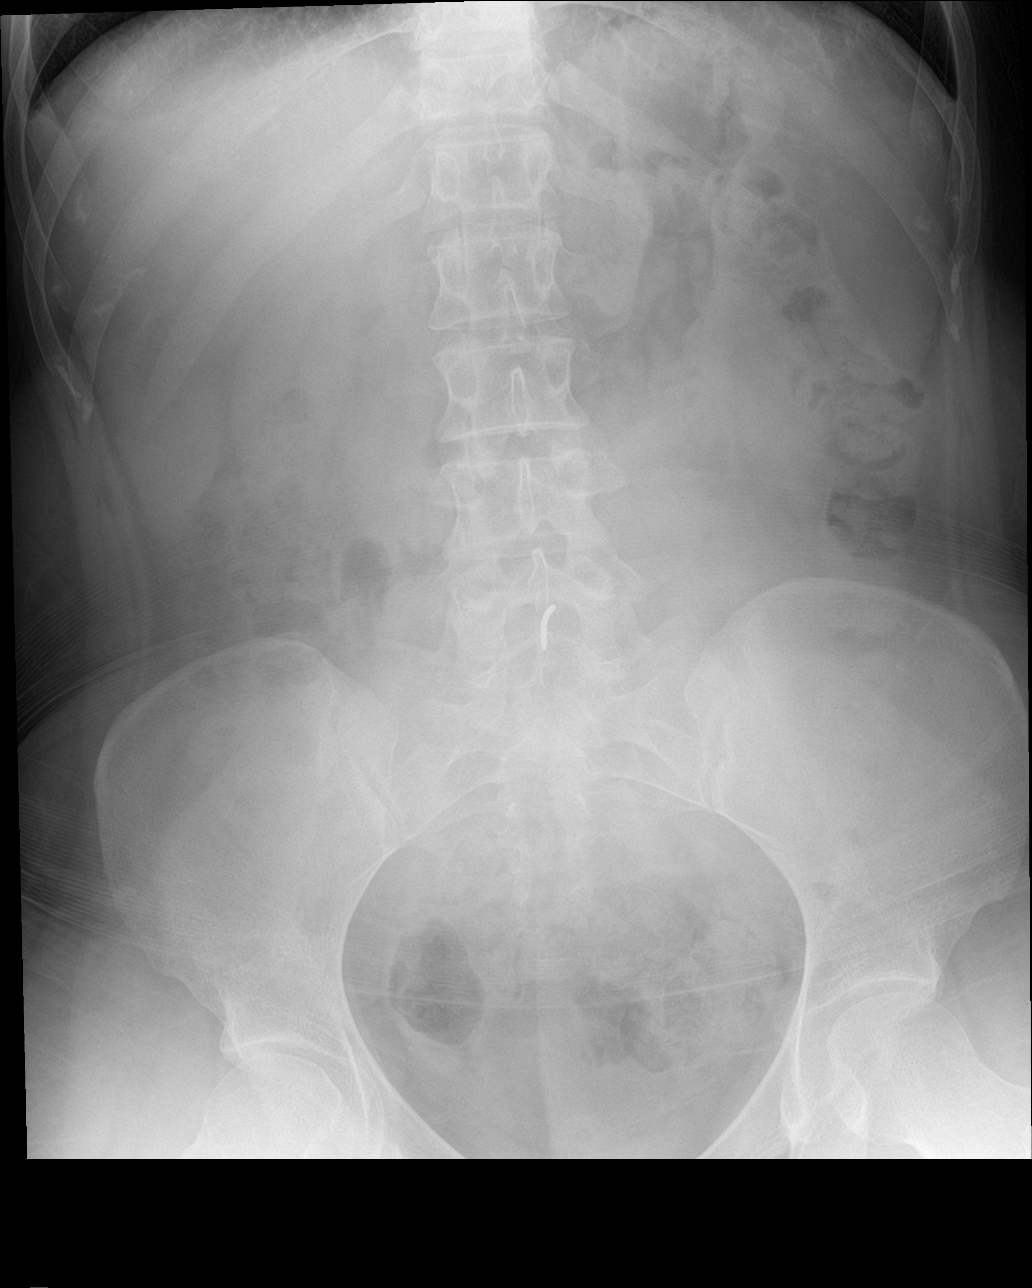

[3 of 3 positions shown; findings below may reference images not displayed]

FINDINGS: Normal mediastinum and cardiac silhouette. Normal pulmonary
vasculature. No evidence of effusion, infiltrate, or pneumothorax.
No acute bony abnormality.

No dilated loops of large or small bowel. Gas and stool in the
rectum. No pathologic calcifications. No organomegaly. No acute
osseous abnormality
IMPRESSION: 1.  No acute cardiopulmonary process.
2. No acute abdominal findings

## 2018-03-31 ENCOUNTER — Emergency Department
Admission: EM | Admit: 2018-03-31 | Discharge: 2018-03-31 | Disposition: A | Discharge status: Home / Self Care | Payer: Medicare Other | Attending: Emergency Medicine | Admitting: Emergency Medicine

## 2018-03-31 ENCOUNTER — Other Ambulatory Visit: Payer: Self-pay

## 2018-03-31 DIAGNOSIS — S20462A Insect bite (nonvenomous) of left back wall of thorax, initial encounter: Secondary | ICD-10-CM

## 2018-03-31 DIAGNOSIS — Y999 Unspecified external cause status: Secondary | ICD-10-CM | POA: Diagnosis not present

## 2018-03-31 DIAGNOSIS — Y929 Unspecified place or not applicable: Secondary | ICD-10-CM | POA: Insufficient documentation

## 2018-03-31 DIAGNOSIS — W57XXXA Bitten or stung by nonvenomous insect and other nonvenomous arthropods, initial encounter: Secondary | ICD-10-CM | POA: Insufficient documentation

## 2018-03-31 DIAGNOSIS — Y939 Activity, unspecified: Secondary | ICD-10-CM | POA: Diagnosis not present

## 2018-03-31 DIAGNOSIS — S20469A Insect bite (nonvenomous) of unspecified back wall of thorax, initial encounter: Secondary | ICD-10-CM | POA: Diagnosis present

## 2018-03-31 MED ORDER — ONDANSETRON 4 MG PO TBDP: 4.0000 mg | ORAL_TABLET | Freq: Three times a day (TID) | ORAL | 0 refills | Status: DC | PRN

## 2018-03-31 MED ORDER — TRIAMCINOLONE ACETONIDE 0.5 % EX OINT: 1.0000 "application " | TOPICAL_OINTMENT | Freq: Two times a day (BID) | CUTANEOUS | 0 refills | Status: DC

## 2018-03-31 MED ORDER — MUPIROCIN 2 % EX OINT: TOPICAL_OINTMENT | CUTANEOUS | 0 refills | Status: DC
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
## 2018-03-31 NOTE — ED Triage Notes (Signed)
Pt arrived via POV with c/o recent insect bite that occurred 5 days ago. Pt reports worsening itching and pain with H/A and nausea. Pt has redness and edema around the insect bite on her upper back. Pt is A&O x4 at this time.

## 2018-04-05 ENCOUNTER — Encounter: Payer: Self-pay | Admitting: Nurse Practitioner

## 2018-04-05 ENCOUNTER — Ambulatory Visit: Payer: Medicare Other | Attending: Nurse Practitioner | Admitting: Nurse Practitioner

## 2018-04-05 ENCOUNTER — Other Ambulatory Visit: Payer: Self-pay

## 2018-04-05 VITALS — BP 103/82 | HR 72 | Temp 98.0°F | Resp 18 | Ht 63.0 in | Wt 142.0 lb

## 2018-04-05 DIAGNOSIS — R1011 Right upper quadrant pain: Secondary | ICD-10-CM

## 2018-04-05 DIAGNOSIS — Z79899 Other long term (current) drug therapy: Secondary | ICD-10-CM | POA: Diagnosis not present

## 2018-04-05 DIAGNOSIS — R109 Unspecified abdominal pain: Secondary | ICD-10-CM | POA: Diagnosis present

## 2018-04-05 DIAGNOSIS — Z789 Other specified health status: Secondary | ICD-10-CM

## 2018-04-05 DIAGNOSIS — G894 Chronic pain syndrome: Secondary | ICD-10-CM | POA: Diagnosis not present

## 2018-04-05 DIAGNOSIS — M899 Disorder of bone, unspecified: Secondary | ICD-10-CM | POA: Diagnosis not present

## 2018-04-05 DIAGNOSIS — Z79891 Long term (current) use of opiate analgesic: Secondary | ICD-10-CM | POA: Diagnosis not present

## 2018-04-05 DIAGNOSIS — G8929 Other chronic pain: Secondary | ICD-10-CM

## 2018-04-05 NOTE — Patient Instructions (Signed)

## 2018-04-05 NOTE — Progress Notes (Signed)
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 cancellation of the original appointment. For this reason they both fall within the category of "Cancellations".  10. Cancellations:  They require advanced notification. Any cancellation less than 24 hours before the  appointment will be recorded as a "No Show". 11. No Show:  Defined as an unkept appointment where the patient failed to notify or declare to the practice their intention or inability to keep the appointment.  Corrective process for repeat offenders:  1. Tardiness: Three (3) episodes of rescheduling due to late arrivals will be recorded as one (1) "No Show". 2. Cancellation or reschedule: Three (3) cancellations or rescheduling will be recorded as one (1) "No Show". 3. "No Shows": Three (3) "No Shows" within a 12 month period will result in discharge from the practice. ____________________________________________________________________________________________  ____________________________________________________________________________________________  Pain  Scale  Introduction: The pain score used by this practice is the Verbal Numerical Rating Scale (VNRS-11). This is an 11-point scale. It is for adults and children 10 years or older. There are significant differences in how the pain score is reported, used, and applied. Forget everything you learned in the past and learn this scoring system.  General Information: The scale should reflect your current level of pain. Unless you are specifically asked for the level of your worst pain, or your average pain. If you are asked for one of these two, then it should be understood that it is over the past 24 hours.  Basic Activities of Daily Living (ADL): Personal hygiene, dressing, eating, transferring, and using restroom.  Instructions: Most patients tend to report their level of pain as a combination of two factors, their physical pain and their psychosocial pain. This last one is also known as "suffering" and it is reflection of how physical pain  affects you socially and psychologically. From now on, report them separately. From this point on, when asked to report your pain level, report only your physical pain. Use the following table for reference.  Pain Clinic Pain Levels (0-5/10)  Pain Level Score  Description  No Pain 0   Mild pain 1 Nagging, annoying, but does not interfere with basic activities of daily living (ADL). Patients are able to eat, bathe, get dressed, toileting (being able to get on and off the toilet and perform personal hygiene functions), transfer (move in and out of bed or a chair without assistance), and maintain continence (able to control bladder and bowel functions). Blood pressure and heart rate are unaffected. A normal heart rate for a healthy adult ranges from 60 to 100 bpm (beats per minute).   Mild to moderate pain 2 Noticeable and distracting. Impossible to hide from other people. More frequent flare-ups. Still possible to adapt and function close to normal. It can be very  annoying and may have occasional stronger flare-ups. With discipline, patients may get used to it and adapt.   Moderate pain 3 Interferes significantly with activities of daily living (ADL). It becomes difficult to feed, bathe, get dressed, get on and off the toilet or to perform personal hygiene functions. Difficult to get in and out of bed or a chair without assistance. Very distracting. With effort, it can be ignored when deeply involved in activities.   Moderately severe pain 4 Impossible to ignore for more than a few minutes. With effort, patients may still be able to manage work or participate in some social activities. Very difficult to concentrate. Signs of autonomic nervous system discharge are evident: dilated pupils (mydriasis); mild sweating (diaphoresis); sleep interference. Heart rate becomes elevated (>115 bpm). Diastolic blood pressure (lower number) rises above 100 mmHg. Patients find relief in laying down and not moving.   Severe pain 5 Intense and extremely unpleasant. Associated with frowning face and frequent crying. Pain overwhelms the senses.  Ability to do any activity or maintain social relationships becomes significantly limited. Conversation becomes difficult. Pacing back and forth is common, as getting into a comfortable position is nearly impossible. Pain wakes you up from deep sleep. Physical signs will be obvious: pupillary dilation; increased sweating; goosebumps; brisk reflexes; cold, clammy hands and feet; nausea, vomiting or dry heaves; loss of appetite; significant sleep disturbance with inability to fall asleep or to remain asleep. When persistent, significant weight loss is observed due to the complete loss of appetite and sleep deprivation.  Blood pressure and heart rate becomes significantly elevated. Caution: If elevated blood pressure triggers a pounding headache associated with blurred vision, then the patient should immediately seek attention at an urgent or  emergency care unit, as these may be signs of an impending stroke.    Emergency Department Pain Levels (6-10/10)  Emergency Room Pain 6 Severely limiting. Requires emergency care and should not be seen or managed at an outpatient pain management facility. Communication becomes difficult and requires great effort. Assistance to reach the emergency department may be required. Facial flushing and profuse sweating along with potentially dangerous increases in heart rate and blood pressure will be evident.   Distressing pain 7 Self-care is very difficult. Assistance is required to transport, or use restroom. Assistance to reach the emergency department will be required. Tasks requiring coordination, such as bathing and getting dressed become very difficult.   Disabling pain 8 Self-care is no longer possible. At this level, pain is disabling. The individual  is unable to do even the most "basic" activities such as walking, eating, bathing, dressing, transferring to a bed, or toileting. Fine motor skills are lost. It is difficult to think clearly.   Incapacitating pain 9 Pain becomes incapacitating. Thought processing is no longer possible. Difficult to remember your own name. Control of movement and coordination are lost.   The worst pain imaginable 10 At this level, most patients pass out from pain. When this level is reached, collapse of the autonomic nervous system occurs, leading to a sudden drop in blood pressure and heart rate. This in turn results in a temporary and dramatic drop in blood flow to the brain, leading to a loss of consciousness. Fainting is one of the body's self defense mechanisms. Passing out puts the brain in a calmed state and causes it to shut down for a while, in order to begin the healing process.    Summary: 1. Refer to this scale when providing [place] with your pain level. 2. Be accurate and careful when reporting your pain level. This will help with your care. 3. Over-reporting  your pain level will lead to loss of credibility. 4. Even a level of 1/10 means that there is pain and will be treated at our facility. 5. High, inaccurate reporting will be documented as "Symptom Exaggeration", leading to loss of credibility and suspicions of possible secondary gains such as obtaining more narcotics, or wanting to appear disabled, for fraudulent reasons. 6. Only pain levels of 5 or below will be seen at our facility. 7. Pain levels of 6 and above will be sent to the Emergency Department and the appointment cancelled. ____________________________________________________________________________________________

## 2018-04-05 NOTE — Progress Notes (Signed)
Safety precautions to be maintained throughout the outpatient stay will include: orient to surroundings, keep bed in low position, maintain call bell within reach at all times, provide assistance with transfer out of bed and ambulation.  

## 2018-04-11 LAB — COMPLIANCE DRUG ANALYSIS, UR

## 2018-04-13 LAB — C-REACTIVE PROTEIN: CRP: <0.3 mg/L (ref 0.0–4.9)

## 2018-04-13 LAB — 25-HYDROXY VITAMIN D LCMS D2+D3
25-Hydroxy, Vitamin D-2: <1 ng/mL
25-Hydroxy, Vitamin D: 32 ng/mL

## 2018-04-13 LAB — COMP. METABOLIC PANEL (12)
ALBUMIN: 4.2 g/dL (ref 3.5–5.5)
ALK PHOS: 67 IU/L (ref 39–117)
AST: 13 IU/L (ref 0–40)
Albumin/Globulin Ratio: 2.1 (ref 1.2–2.2)
BILIRUBIN TOTAL: 0.2 mg/dL (ref 0.0–1.2)
BUN/Creatinine Ratio: 17 (ref 9–23)
BUN: 13 mg/dL (ref 6–20)
CHLORIDE: 104 mmol/L (ref 96–106)
Calcium: 9.2 mg/dL (ref 8.7–10.2)
Creatinine, Ser: 0.75 mg/dL (ref 0.57–1.00)
GFR calc Af Amer: 129 mL/min/{1.73_m2} (ref >59)
GFR calc non Af Amer: 112 mL/min/{1.73_m2} (ref >59)
GLOBULIN, TOTAL: 2 g/dL (ref 1.5–4.5)
Glucose: 72 mg/dL (ref 65–99)
POTASSIUM: 4.3 mmol/L (ref 3.5–5.2)
Sodium: 141 mmol/L (ref 134–144)
Total Protein: 6.2 g/dL (ref 6.0–8.5)

## 2018-04-13 LAB — VITAMIN B12: VITAMIN B 12: 334 pg/mL (ref 232–1245)

## 2018-04-13 LAB — SEDIMENTATION RATE: Sed Rate: 2 mm/hr (ref 0–32)

## 2018-04-13 LAB — 25-HYDROXYVITAMIN D LCMS D2+D3: 25-HYDROXY, VITAMIN D-3: 32 ng/mL

## 2018-04-13 LAB — MAGNESIUM: MAGNESIUM: 2 mg/dL (ref 1.6–2.3)

## 2018-04-20 ENCOUNTER — Ambulatory Visit (INDEPENDENT_AMBULATORY_CARE_PROVIDER_SITE_OTHER): Payer: Medicare Other | Admitting: Certified Nurse Midwife

## 2018-04-20 ENCOUNTER — Encounter

## 2018-04-20 ENCOUNTER — Encounter: Payer: Self-pay | Admitting: Certified Nurse Midwife

## 2018-04-20 VITALS — BP 92/58 | HR 64 | Ht 63.0 in | Wt 137.3 lb

## 2018-04-20 DIAGNOSIS — R102 Pelvic and perineal pain: Secondary | ICD-10-CM | POA: Diagnosis not present

## 2018-04-20 LAB — POCT URINALYSIS DIPSTICK
BILIRUBIN UA: NEGATIVE
Glucose, UA: NEGATIVE
KETONES UA: NEGATIVE
Leukocytes, UA: NEGATIVE
Nitrite, UA: NEGATIVE
ODOR: NEGATIVE
Protein, UA: NEGATIVE
Spec Grav, UA: 1.02 (ref 1.010–1.025)
Urobilinogen, UA: 0.2 E.U./dL
pH, UA: 7.5 (ref 5.0–8.0)

## 2018-04-20 MED ORDER — LIDOCAINE 5 % EX OINT: 1.0000 "application " | TOPICAL_OINTMENT | Freq: Every day | CUTANEOUS | 0 refills | Status: DC | PRN
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
## 2018-04-20 NOTE — Addendum Note (Signed)
Addended by: Marchelle Folks on: 04/20/2018 10:09 AM   Modules accepted: Orders

## 2018-04-20 NOTE — Patient Instructions (Signed)
Dyspareunia, Female Dyspareunia is pain that is associated with sexual activity. This can affect any part of the genitals or lower abdomen, and there are many possible causes. This condition ranges from mild to severe. Depending on the cause, dyspareunia may get better with treatment, or it may return (recur) over time. What are the causes? The cause of this condition is not always known. Possible causes include:  Cancer.  Psychological factors, such as depression, anxiety, or previous traumatic experiences.  Severe pain and tenderness of the skin around the vagina (vulva) when it is touched (vulvar vestibulitis syndrome).  Infection of the pelvis or the vulva.  Infection of the vagina.  Painful, involuntary tightening (contraction) of the vaginal muscles when anything is put inside the vagina (vaginismus).  Allergic reaction.  Ovarian cysts.  Solid growths of tissue (tumors) in the ovaries or the uterus.  Scar tissue in the ovaries, vagina, or pelvis.  Vaginal dryness.  Thinning of the tissue (atrophy) of the vulva and vagina.  Skin conditions that affect the vulva (vulvar dermatoses), such as lichen sclerosus or lichen planus.  Endometriosis.  Tubal pregnancy.  A tilted uterus.  Uterine prolapse.  Adhesions in the vagina.  Bladder problems.  Intestinal problems.  Certain medicines.  Medical conditions such as diabetes, arthritis, or thyroid disease.  What increases the risk? The following factors may make you more likely to develop this condition:  Having experienced physical or sexual trauma.  Having given birth more than once.  Taking birth control pills.  Having gone through menopause.  Having recently given birth, typically within the past 3-6 months.  Breastfeeding.  What are the signs or symptoms? The main symptom of this condition is pain in any part of the genitals or lower abdomen during or after sexual activity. This may include pain  during sexual arousal, genital stimulation, or orgasm. Pain may get worse when anything is inserted into the vagina, or when the genitals are touched in any way, such as when sitting or wearing pants. Pain can range from mild to severe, depending on the cause of the condition. In some cases, symptoms go away with treatment and return (recur) at a later date. How is this diagnosed? This condition may be diagnosed based on:  Your symptoms, including: ? Where your pain is located. ? When your pain occurs.  Your medical history.  A physical exam. This may include a pelvic exam and a Pap test. This is a screening test that is used to check for signs of cancer of the vagina, cervix, and uterus.  Tests, including: ? Blood tests. ? Ultrasound. This uses sound waves to make a picture of the area that is being tested. ? Urine culture. This test involves checking a urine sample for signs of infection. ? Culture test. This is when your health care provider uses a swab to collect a sample of vaginal fluid. The sample is checked for signs of infection. ? X-rays. ? MRI. ? CT scan. ? Laparoscopy. This is a procedure in which a small incision is made in your lower abdomen and a lighted, pencil-sized instrument (laparoscope) is passed through the incision and used to look inside your pelvis.  You may be referred to a health care provider who specializes in women's health (gynecologist). In some cases, diagnosing the cause of dyspareunia can be difficult. How is this treated? Treatment depends on the cause of your condition and your symptoms. In most cases, you may need to stop sexual activity until your symptoms   improve. Treatment may include:  Lubricants.  Kegel exercises or vaginal dilators.  Medicated skin creams.  Medicated vaginal creams.  Hormonal therapy.  Antibiotic medicine to prevent or fight infection.  Medicines that help to relieve pain.  Medicines that treat depression  (antidepressants).  Psychological counseling.  Sex therapy.  Surgery.  Follow these instructions at home: Lifestyle  Avoid tight clothing and irritating materials around your genital and abdominal area.  Use water-based lubricants as needed. Avoid oil-based lubricants.  Do not use any products that irritate you. This may include certain condoms, spermicides, lubricants, soaps, tampons, vaginal sprays, or douches.  Always practice safe sex. Talk with your health care provider about which form of birth control (contraception) is best for you.  Maintain open communication with your sexual partner. General instructions  Take over-the-counter and prescription medicines only as told by your health care provider.  If you had tests done, it is your responsibility to get your tests results. Ask your health care provider or the department performing the test when your results will be ready.  Urinate before you engage in sexual activity.  Consider joining a support group.  Keep all follow-up visits as told by your health care provider. This is important. Contact a health care provider if:  You develop vaginal bleeding after sexual intercourse.  You develop a lump at the opening of your vagina. Seek medical care even if the lump is painless.  You have: ? Abnormal vaginal discharge. ? Vaginal dryness. ? Itchiness or irritation of your vulva or vagina. ? A new rash. ? Symptoms that get worse or do not improve with treatment. ? A fever. ? Pain when you urinate. ? Blood in your urine. Get help right away if:  You develop severe pain in your abdomen during or shortly after sexual intercourse.  You pass out after having sexual intercourse. This information is not intended to replace advice given to you by your health care provider. Make sure you discuss any questions you have with your health care provider. Document Released: 12/04/2007 Document Revised: 03/25/2016 Document  Reviewed: 06/16/2015 Elsevier Interactive Patient Education  2018 [employer]  

## 2018-04-24 ENCOUNTER — Telehealth: Payer: Self-pay

## 2018-04-24 ENCOUNTER — Other Ambulatory Visit: Payer: Self-pay | Admitting: Certified Nurse Midwife

## 2018-04-24 LAB — NUSWAB VAGINITIS PLUS (VG+)
BVAB 2: HIGH {score} — AB
CANDIDA GLABRATA, NAA: NEGATIVE
Candida albicans, NAA: NEGATIVE
Chlamydia trachomatis, NAA: NEGATIVE
MEGASPHAERA 1: HIGH {score} — AB
Neisseria gonorrhoeae, NAA: NEGATIVE
TRICH VAG BY NAA: NEGATIVE

## 2018-04-24 MED ORDER — METRONIDAZOLE 500 MG PO TABS: 500.0000 mg | ORAL_TABLET | Freq: Two times a day (BID) | ORAL | 0 refills | Status: AC

## 2018-04-24 NOTE — Telephone Encounter (Signed)
Pt informed of AT instructions.

## 2018-04-24 NOTE — Progress Notes (Signed)
nuswab pos for BV order placed for flagyl.   Doreene Burke, CNM

## 2018-07-07 ENCOUNTER — Other Ambulatory Visit: Payer: Self-pay

## 2018-07-07 ENCOUNTER — Emergency Department (HOSPITAL_COMMUNITY): Payer: Medicare Other

## 2018-07-07 ENCOUNTER — Encounter (HOSPITAL_COMMUNITY): Payer: Self-pay | Admitting: Emergency Medicine

## 2018-07-07 ENCOUNTER — Emergency Department (HOSPITAL_COMMUNITY)
Admission: EM | Admit: 2018-07-07 | Discharge: 2018-07-07 | Disposition: A | Discharge status: Home / Self Care | Payer: Medicare Other | Attending: Emergency Medicine | Admitting: Emergency Medicine

## 2018-07-07 DIAGNOSIS — R102 Pelvic and perineal pain: Secondary | ICD-10-CM | POA: Diagnosis not present

## 2018-07-07 DIAGNOSIS — K529 Noninfective gastroenteritis and colitis, unspecified: Secondary | ICD-10-CM | POA: Diagnosis not present

## 2018-07-07 DIAGNOSIS — K5900 Constipation, unspecified: Secondary | ICD-10-CM | POA: Diagnosis present

## 2018-07-07 LAB — URINALYSIS, ROUTINE W REFLEX MICROSCOPIC
Bacteria, UA: NONE SEEN
Bilirubin Urine: NEGATIVE
Glucose, UA: NEGATIVE mg/dL
Ketones, ur: 80 mg/dL — AB
Leukocytes, UA: NEGATIVE
NITRITE: NEGATIVE
PROTEIN: NEGATIVE mg/dL
SPECIFIC GRAVITY, URINE: 1.02 (ref 1.005–1.030)
pH: 5 (ref 5.0–8.0)

## 2018-07-07 LAB — COMPREHENSIVE METABOLIC PANEL
ALBUMIN: 4 g/dL (ref 3.5–5.0)
ALK PHOS: 92 U/L (ref 38–126)
ALT: 50 U/L — AB (ref 0–44)
AST: 72 U/L — AB (ref 15–41)
Anion gap: 9 (ref 5–15)
BILIRUBIN TOTAL: 0.7 mg/dL (ref 0.3–1.2)
BUN: 19 mg/dL (ref 6–20)
CALCIUM: 9.2 mg/dL (ref 8.9–10.3)
CO2: 22 mmol/L (ref 22–32)
Chloride: 107 mmol/L (ref 98–111)
Creatinine, Ser: 0.65 mg/dL (ref 0.44–1.00)
GFR calc Af Amer: >60 mL/min (ref >60)
GFR calc non Af Amer: >60 mL/min (ref >60)
Glucose, Bld: 105 mg/dL — ABNORMAL HIGH (ref 70–99)
Potassium: 3.7 mmol/L (ref 3.5–5.1)
Sodium: 138 mmol/L (ref 135–145)
TOTAL PROTEIN: 6.5 g/dL (ref 6.5–8.1)

## 2018-07-07 LAB — CBC
HCT: 36.6 % (ref 36.0–46.0)
Hemoglobin: 11.7 g/dL — ABNORMAL LOW (ref 12.0–15.0)
MCH: 26.5 pg (ref 26.0–34.0)
MCHC: 32 g/dL (ref 30.0–36.0)
MCV: 83 fL (ref 78.0–100.0)
Platelets: 196 10*3/uL (ref 150–400)
RBC: 4.41 MIL/uL (ref 3.87–5.11)
RDW: 14.6 % (ref 11.5–15.5)
WBC: 13.7 10*3/uL — ABNORMAL HIGH (ref 4.0–10.5)

## 2018-07-07 LAB — I-STAT BETA HCG BLOOD, ED (MC, WL, AP ONLY): I-stat hCG, quantitative: <5 m[IU]/mL (ref <5)

## 2018-07-07 LAB — LIPASE, BLOOD: Lipase: 29 U/L (ref 11–51)

## 2018-07-07 LAB — POC OCCULT BLOOD, ED: Fecal Occult Bld: NEGATIVE

## 2018-07-07 MED ORDER — CIPROFLOXACIN IN D5W 400 MG/200ML IV SOLN
400.0000 mg | Freq: Once | INTRAVENOUS | Status: AC
  Administered 2018-07-07: 400 mg via INTRAVENOUS
  Filled 2018-07-07: qty 200

## 2018-07-07 MED ORDER — ACETAMINOPHEN 325 MG PO TABS
650.0000 mg | ORAL_TABLET | Freq: Once | ORAL | Status: AC
  Administered 2018-07-07: 650 mg via ORAL
  Filled 2018-07-07: qty 2

## 2018-07-07 MED ORDER — MORPHINE SULFATE (PF) 4 MG/ML IV SOLN
4.0000 mg | Freq: Once | INTRAVENOUS | Status: AC
  Administered 2018-07-07: 4 mg via INTRAVENOUS
  Filled 2018-07-07: qty 1

## 2018-07-07 MED ORDER — METRONIDAZOLE 500 MG PO TABS
500.0000 mg | ORAL_TABLET | Freq: Two times a day (BID) | ORAL | 0 refills | Status: DC
Start: 2018-07-07 — End: 2018-09-20

## 2018-07-07 MED ORDER — CIPROFLOXACIN HCL 500 MG PO TABS: 500.0000 mg | ORAL_TABLET | Freq: Two times a day (BID) | ORAL | 0 refills | Status: DC

## 2018-07-07 MED ORDER — METRONIDAZOLE IN NACL 5-0.79 MG/ML-% IV SOLN
500.0000 mg | Freq: Once | INTRAVENOUS | Status: AC
  Administered 2018-07-07: 500 mg via INTRAVENOUS
  Filled 2018-07-07: qty 100

## 2018-07-07 MED ORDER — ONDANSETRON 4 MG PO TBDP
4.0000 mg | ORAL_TABLET | Freq: Once | ORAL | Status: AC | PRN
  Administered 2018-07-07: 4 mg via ORAL
  Filled 2018-07-07: qty 1

## 2018-07-07 MED ORDER — PROMETHAZINE HCL 25 MG/ML IJ SOLN
25.0000 mg | Freq: Once | INTRAMUSCULAR | Status: AC
  Administered 2018-07-07: 25 mg via INTRAVENOUS
  Filled 2018-07-07: qty 1

## 2018-07-07 MED ORDER — ONDANSETRON HCL 4 MG/2ML IJ SOLN
4.0000 mg | Freq: Once | INTRAMUSCULAR | Status: AC
  Administered 2018-07-07: 4 mg via INTRAVENOUS
  Filled 2018-07-07: qty 2

## 2018-07-07 MED ORDER — SODIUM CHLORIDE 0.9 % IV BOLUS
1000.0000 mL | Freq: Once | INTRAVENOUS | Status: AC
  Administered 2018-07-07 (×2): 1000 mL via INTRAVENOUS

## 2018-07-07 MED ORDER — IOHEXOL 300 MG/ML  SOLN
100.0000 mL | Freq: Once | INTRAMUSCULAR | Status: AC | PRN
  Administered 2018-07-07: 100 mL via INTRAVENOUS

## 2018-07-07 MED ORDER — SODIUM CHLORIDE 0.9 % IV BOLUS
1000.0000 mL | Freq: Once | INTRAVENOUS | Status: AC
  Administered 2018-07-07: 1000 mL via INTRAVENOUS

## 2018-07-07 NOTE — ED Notes (Signed)
Attempted UA collection, patient unable to urinate d/t discomfort.

## 2018-07-07 NOTE — ED Notes (Addendum)
Patient transported to CT 

## 2018-07-07 NOTE — Discharge Instructions (Signed)
You have been diagnosed with colitis, or infection of your colon.  Please take antibiotics as prescribed for the full duration.  You may take tylenol or ibuprofen at home as needed for pain.  Return if you have any concerns.

## 2018-07-07 NOTE — ED Notes (Signed)
Pt has been back and forth to bathroom 5x to attempt to provide urine sample, unable to provide sample. Pt reports having a watery bowel movement just prior to this interview. States "I don't know why it's so hard to pee. Usually I can after all these fluids." Pt is reminded on need for sample.

## 2018-07-07 NOTE — ED Notes (Signed)
Patient Alert and oriented to baseline. Stable and ambulatory to baseline. Patient verbalized understanding of the discharge instructions.  Patient belongings were taken by the patient.   

## 2018-07-07 NOTE — ED Notes (Signed)
Patient back from CT.
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
## 2018-07-07 NOTE — ED Triage Notes (Signed)
Pt presents with constipation, had BM this morning, then onset of lower/pelvic abd pain and lower back pain when she laid down after the BM; pt reports some nausea, no vomiting

## 2018-08-08 ENCOUNTER — Encounter: Payer: Self-pay | Admitting: Obstetrics and Gynecology

## 2018-09-18 ENCOUNTER — Emergency Department: Payer: Medicare Other

## 2018-09-18 ENCOUNTER — Emergency Department
Admission: EM | Admit: 2018-09-18 | Discharge: 2018-09-18 | Disposition: A | Discharge status: Home / Self Care | Payer: Medicare Other | Attending: Emergency Medicine | Admitting: Emergency Medicine

## 2018-09-18 ENCOUNTER — Other Ambulatory Visit: Payer: Self-pay

## 2018-09-18 ENCOUNTER — Encounter: Payer: Self-pay | Admitting: Emergency Medicine

## 2018-09-18 DIAGNOSIS — R1030 Lower abdominal pain, unspecified: Secondary | ICD-10-CM | POA: Diagnosis present

## 2018-09-18 DIAGNOSIS — Z3A01 Less than 8 weeks gestation of pregnancy: Secondary | ICD-10-CM | POA: Diagnosis not present

## 2018-09-18 DIAGNOSIS — O9989 Other specified diseases and conditions complicating pregnancy, childbirth and the puerperium: Secondary | ICD-10-CM | POA: Diagnosis not present

## 2018-09-18 DIAGNOSIS — O26891 Other specified pregnancy related conditions, first trimester: Secondary | ICD-10-CM

## 2018-09-18 DIAGNOSIS — R109 Unspecified abdominal pain: Secondary | ICD-10-CM

## 2018-09-18 DIAGNOSIS — R1084 Generalized abdominal pain: Secondary | ICD-10-CM | POA: Insufficient documentation

## 2018-09-18 LAB — CHLAMYDIA/NGC RT PCR (ARMC ONLY)
CHLAMYDIA TR: NOT DETECTED
N GONORRHOEAE: NOT DETECTED

## 2018-09-18 LAB — URINALYSIS, COMPLETE (UACMP) WITH MICROSCOPIC
Bacteria, UA: NONE SEEN
Bilirubin Urine: NEGATIVE
GLUCOSE, UA: NEGATIVE mg/dL
KETONES UR: NEGATIVE mg/dL
Leukocytes, UA: NEGATIVE
Nitrite: NEGATIVE
PROTEIN: NEGATIVE mg/dL
Specific Gravity, Urine: 1.018 (ref 1.005–1.030)
pH: 5 (ref 5.0–8.0)

## 2018-09-18 LAB — HCG, QUANTITATIVE, PREGNANCY: HCG, BETA CHAIN, QUANT, S: 1154 m[IU]/mL — AB (ref <5)

## 2018-09-18 LAB — COMPREHENSIVE METABOLIC PANEL
ALT: 8 U/L (ref 0–44)
AST: 14 U/L — ABNORMAL LOW (ref 15–41)
Albumin: 4.1 g/dL (ref 3.5–5.0)
Alkaline Phosphatase: 54 U/L (ref 38–126)
Anion gap: 4 — ABNORMAL LOW (ref 5–15)
BILIRUBIN TOTAL: 0.3 mg/dL (ref 0.3–1.2)
BUN: 10 mg/dL (ref 6–20)
CALCIUM: 9 mg/dL (ref 8.9–10.3)
CO2: 25 mmol/L (ref 22–32)
Chloride: 109 mmol/L (ref 98–111)
Creatinine, Ser: 0.6 mg/dL (ref 0.44–1.00)
GFR calc Af Amer: >60 mL/min (ref >60)
GFR calc non Af Amer: >60 mL/min (ref >60)
GLUCOSE: 98 mg/dL (ref 70–99)
POTASSIUM: 3.7 mmol/L (ref 3.5–5.1)
Sodium: 138 mmol/L (ref 135–145)
TOTAL PROTEIN: 6.4 g/dL — AB (ref 6.5–8.1)

## 2018-09-18 LAB — WET PREP, GENITAL
Trich, Wet Prep: NONE SEEN
Yeast Wet Prep HPF POC: NONE SEEN

## 2018-09-18 LAB — CBC
HCT: 32.5 % — ABNORMAL LOW (ref 36.0–46.0)
HEMOGLOBIN: 10.9 g/dL — AB (ref 12.0–15.0)
MCH: 27.8 pg (ref 26.0–34.0)
MCHC: 33.5 g/dL (ref 30.0–36.0)
MCV: 82.9 fL (ref 80.0–100.0)
Platelets: 272 10*3/uL (ref 150–400)
RBC: 3.92 MIL/uL (ref 3.87–5.11)
RDW: 14.1 % (ref 11.5–15.5)
WBC: 7.9 10*3/uL (ref 4.0–10.5)
nRBC: 0 % (ref 0.0–0.2)

## 2018-09-18 LAB — LIPASE, BLOOD: LIPASE: 25 U/L (ref 11–51)

## 2018-09-18 NOTE — Discharge Instructions (Addendum)
As of now, we have been unable to visualize a pregnancy on ultrasound.  This can be due to very early pregnancy, a pregnancy outside of the uterus, or a spontaneous abortion.  It is important that you have repeat pregnancy hormones in 48 hours because if they are trending up we need to make sure that you do not have a pregnancy outside of the uterus which can make you very sick.  Follow-up with your OB/GYN or primary care doctor for this lab work.  If you are unable to see a doctor return to the emergency room for evaluation.

## 2018-09-18 NOTE — ED Triage Notes (Signed)
Pt in via POV, reports pelvic cramping x approximately 3 days.  Pt with positive pregnancy test x 2 days ago.  Pt denies any vaginal bleeding, denies any vomiting/diarrhea, denies any urinary symptoms.  Vitals WDL, NAD noted at this time.
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
## 2018-09-19 LAB — POCT PREGNANCY, URINE: Preg Test, Ur: POSITIVE — AB

## 2018-09-20 ENCOUNTER — Ambulatory Visit (INDEPENDENT_AMBULATORY_CARE_PROVIDER_SITE_OTHER): Payer: Medicare Other | Admitting: Obstetrics and Gynecology

## 2018-09-20 ENCOUNTER — Encounter: Payer: Self-pay | Admitting: Obstetrics and Gynecology

## 2018-09-20 VITALS — BP 101/57 | HR 84 | Ht 63.0 in | Wt 139.0 lb

## 2018-09-20 DIAGNOSIS — R102 Pelvic and perineal pain: Secondary | ICD-10-CM

## 2018-09-20 DIAGNOSIS — O26891 Other specified pregnancy related conditions, first trimester: Secondary | ICD-10-CM

## 2018-09-20 DIAGNOSIS — N926 Irregular menstruation, unspecified: Secondary | ICD-10-CM | POA: Diagnosis not present

## 2018-09-20 MED ORDER — ONDANSETRON 4 MG PO TBDP: 4.0000 mg | ORAL_TABLET | Freq: Four times a day (QID) | ORAL | 0 refills | Status: DC | PRN

## 2018-09-20 MED ORDER — CITRANATAL BLOOM 90-1 MG PO TABS: 1.0000 | ORAL_TABLET | Freq: Every day | ORAL | 11 refills | Status: DC
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
## 2018-09-20 NOTE — Patient Instructions (Signed)
First Trimester of Pregnancy The first trimester of pregnancy is from week 1 until the end of week 13 (months 1 through 3). During this time, your baby will begin to develop inside you. At 6-8 weeks, the eyes and face are formed, and the heartbeat can be seen on ultrasound. At the end of 12 weeks, all the baby's organs are formed. Prenatal care is all the medical care you receive before the birth of your baby. Make sure you get good prenatal care and follow all of your doctor's instructions. Follow these instructions at home: Medicines  Take over-the-counter and prescription medicines only as told by your doctor. Some medicines are safe and some medicines are not safe during pregnancy.  Take a prenatal vitamin that contains at least 600 micrograms (mcg) of folic acid.  If you have trouble pooping (constipation), take medicine that will make your stool soft (stool softener) if your doctor approves. Eating and drinking  Eat regular, healthy meals.  Your doctor will tell you the amount of weight gain that is right for you.  Avoid raw meat and uncooked cheese.  If you feel sick to your stomach (nauseous) or throw up (vomit): ? Eat 4 or 5 small meals a day instead of 3 large meals. ? Try eating a few soda crackers. ? Drink liquids between meals instead of during meals.  To prevent constipation: ? Eat foods that are high in fiber, like fresh fruits and vegetables, whole grains, and beans. ? Drink enough fluids to keep your pee (urine) clear or pale yellow. Activity  Exercise only as told by your doctor. Stop exercising if you have cramps or pain in your lower belly (abdomen) or low back.  Do not exercise if it is too hot, too humid, or if you are in a place of great height (high altitude).  Try to avoid standing for long periods of time. Move your legs often if you must stand in one place for a long time.  Avoid heavy lifting.  Wear low-heeled shoes. Sit and stand up straight.  You  can have sex unless your doctor tells you not to. Relieving pain and discomfort  Wear a good support bra if your breasts are sore.  Take warm water baths (sitz baths) to soothe pain or discomfort caused by hemorrhoids. Use hemorrhoid cream if your doctor says it is okay.  Rest with your legs raised if you have leg cramps or low back pain.  If you have puffy, bulging veins (varicose veins) in your legs: ? Wear support hose or compression stockings as told by your doctor. ? Raise (elevate) your feet for 15 minutes, 3-4 times a day. ? Limit salt in your food. Prenatal care  Schedule your prenatal visits by the twelfth week of pregnancy.  Write down your questions. Take them to your prenatal visits.  Keep all your prenatal visits as told by your doctor. This is important. Safety  Wear your seat belt at all times when driving.  Make a list of emergency phone numbers. The list should include numbers for family, friends, the hospital, and police and fire departments. General instructions  Ask your doctor for a referral to a local prenatal class. Begin classes no later than at the start of month 6 of your pregnancy.  Ask for help if you need counseling or if you need help with nutrition. Your doctor can give you advice or tell you where to go for help.  Do not use hot tubs, steam rooms, or   saunas.  Do not douche or use tampons or scented sanitary pads.  Do not cross your legs for long periods of time.  Avoid all herbs and alcohol. Avoid drugs that are not approved by your doctor.  Do not use any tobacco products, including cigarettes, chewing tobacco, and electronic cigarettes. If you need help quitting, ask your doctor. You may get counseling or other support to help you quit.  Avoid cat litter boxes and soil used by cats. These carry germs that can cause birth defects in the baby and can cause a loss of your baby (miscarriage) or stillbirth.  Visit your dentist. At home, brush  your teeth with a soft toothbrush. Be gentle when you floss. Contact a doctor if:  You are dizzy.  You have mild cramps or pressure in your lower belly.  You have a nagging pain in your belly area.  You continue to feel sick to your stomach, you throw up, or you have watery poop (diarrhea).  You have a bad smelling fluid coming from your vagina.  You have pain when you pee (urinate).  You have increased puffiness (swelling) in your face, hands, legs, or ankles. Get help right away if:  You have a fever.  You are leaking fluid from your vagina.  You have spotting or bleeding from your vagina.  You have very bad belly cramping or pain.  You gain or lose weight rapidly.  You throw up blood. It may look like coffee grounds.  You are around people who have German measles, fifth disease, or chickenpox.  You have a very bad headache.  You have shortness of breath.  You have any kind of trauma, such as from a fall or a car accident. Summary  The first trimester of pregnancy is from week 1 until the end of week 13 (months 1 through 3).  To take care of yourself and your unborn baby, you will need to eat healthy meals, take medicines only if your doctor tells you to do so, and do activities that are safe for you and your baby.  Keep all follow-up visits as told by your doctor. This is important as your doctor will have to ensure that your baby is healthy and growing well. This information is not intended to replace advice given to you by your health care provider. Make sure you discuss any questions you have with your health care provider. Document Released: 05/02/2008 Document Revised: 11/22/2016 Document Reviewed: 11/22/2016 Elsevier Interactive Patient Education  2017 [employer]  

## 2018-09-21 LAB — BETA HCG QUANT (REF LAB): HCG QUANT: 1814 m[IU]/mL

## 2018-09-25 ENCOUNTER — Encounter: Payer: Self-pay | Admitting: *Deleted

## 2018-10-02 ENCOUNTER — Ambulatory Visit (INDEPENDENT_AMBULATORY_CARE_PROVIDER_SITE_OTHER): Payer: Medicare Other

## 2018-10-02 ENCOUNTER — Ambulatory Visit: Payer: Medicare Other | Admitting: Obstetrics and Gynecology

## 2018-10-02 VITALS — BP 88/60 | HR 68 | Ht 63.0 in | Wt 139.6 lb

## 2018-10-02 DIAGNOSIS — N8311 Corpus luteum cyst of right ovary: Secondary | ICD-10-CM

## 2018-10-02 DIAGNOSIS — N926 Irregular menstruation, unspecified: Secondary | ICD-10-CM

## 2018-10-02 DIAGNOSIS — O26891 Other specified pregnancy related conditions, first trimester: Secondary | ICD-10-CM

## 2018-10-02 DIAGNOSIS — Z3A01 Less than 8 weeks gestation of pregnancy: Secondary | ICD-10-CM | POA: Diagnosis not present

## 2018-10-02 DIAGNOSIS — Z3491 Encounter for supervision of normal pregnancy, unspecified, first trimester: Secondary | ICD-10-CM

## 2018-10-02 DIAGNOSIS — R102 Pelvic and perineal pain: Secondary | ICD-10-CM

## 2018-10-02 DIAGNOSIS — O3411 Maternal care for benign tumor of corpus uteri, first trimester: Secondary | ICD-10-CM

## 2018-10-02 NOTE — Progress Notes (Signed)
Renee [name] presents for NOB nurse interview visit. Pregnancy confirmation done here at [facility].   G-3 .  P- 2   . Pregnancy education material explained and given. No cats in the home. NOB labs ordered.Marland Kitchen HIV labs and Drug screen were explained optional and she did not decline. Drug screen ordered/declined. PNV encouraged. Genetic screening options discussed./Unsure.  Pt may discuss with provider. Pt. To follow up with provider in _4_ weeks for NOB physical.  We discussed FMLA paper and financial policy.  All questions answered.

## 2018-10-02 NOTE — Patient Instructions (Signed)
First Trimester of Pregnancy The first trimester of pregnancy is from week 1 until the end of week 13 (months 1 through 3). During this time, your baby will begin to develop inside you. At 6-8 weeks, the eyes and face are formed, and the heartbeat can be seen on ultrasound. At the end of 12 weeks, all the baby's organs are formed. Prenatal care is all the medical care you receive before the birth of your baby. Make sure you get good prenatal care and follow all of your doctor's instructions. Follow these instructions at home: Medicines  Take over-the-counter and prescription medicines only as told by your doctor. Some medicines are safe and some medicines are not safe during pregnancy.  Take a prenatal vitamin that contains at least 600 micrograms (mcg) of folic acid.  If you have trouble pooping (constipation), take medicine that will make your stool soft (stool softener) if your doctor approves. Eating and drinking  Eat regular, healthy meals.  Your doctor will tell you the amount of weight gain that is right for you.  Avoid raw meat and uncooked cheese.  If you feel sick to your stomach (nauseous) or throw up (vomit): ? Eat 4 or 5 small meals a day instead of 3 large meals. ? Try eating a few soda crackers. ? Drink liquids between meals instead of during meals.  To prevent constipation: ? Eat foods that are high in fiber, like fresh fruits and vegetables, whole grains, and beans. ? Drink enough fluids to keep your pee (urine) clear or pale yellow. Activity  Exercise only as told by your doctor. Stop exercising if you have cramps or pain in your lower belly (abdomen) or low back.  Do not exercise if it is too hot, too humid, or if you are in a place of great height (high altitude).  Try to avoid standing for long periods of time. Move your legs often if you must stand in one place for a long time.  Avoid heavy lifting.  Wear low-heeled shoes. Sit and stand up straight.  You  can have sex unless your doctor tells you not to. Relieving pain and discomfort  Wear a good support bra if your breasts are sore.  Take warm water baths (sitz baths) to soothe pain or discomfort caused by hemorrhoids. Use hemorrhoid cream if your doctor says it is okay.  Rest with your legs raised if you have leg cramps or low back pain.  If you have puffy, bulging veins (varicose veins) in your legs: ? Wear support hose or compression stockings as told by your doctor. ? Raise (elevate) your feet for 15 minutes, 3-4 times a day. ? Limit salt in your food. Prenatal care  Schedule your prenatal visits by the twelfth week of pregnancy.  Write down your questions. Take them to your prenatal visits.  Keep all your prenatal visits as told by your doctor. This is important. Safety  Wear your seat belt at all times when driving.  Make a list of emergency phone numbers. The list should include numbers for family, friends, the hospital, and police and fire departments. General instructions  Ask your doctor for a referral to a local prenatal class. Begin classes no later than at the start of month 6 of your pregnancy.  Ask for help if you need counseling or if you need help with nutrition. Your doctor can give you advice or tell you where to go for help.  Do not use hot tubs, steam rooms, or   saunas.  Do not douche or use tampons or scented sanitary pads.  Do not cross your legs for long periods of time.  Avoid all herbs and alcohol. Avoid drugs that are not approved by your doctor.  Do not use any tobacco products, including cigarettes, chewing tobacco, and electronic cigarettes. If you need help quitting, ask your doctor. You may get counseling or other support to help you quit.  Avoid cat litter boxes and soil used by cats. These carry germs that can cause birth defects in the baby and can cause a loss of your baby (miscarriage) or stillbirth.  Visit your dentist. At home, brush  your teeth with a soft toothbrush. Be gentle when you floss. Contact a doctor if:  You are dizzy.  You have mild cramps or pressure in your lower belly.  You have a nagging pain in your belly area.  You continue to feel sick to your stomach, you throw up, or you have watery poop (diarrhea).  You have a bad smelling fluid coming from your vagina.  You have pain when you pee (urinate).  You have increased puffiness (swelling) in your face, hands, legs, or ankles. Get help right away if:  You have a fever.  You are leaking fluid from your vagina.  You have spotting or bleeding from your vagina.  You have very bad belly cramping or pain.  You gain or lose weight rapidly.  You throw up blood. It may look like coffee grounds.  You are around people who have German measles, fifth disease, or chickenpox.  You have a very bad headache.  You have shortness of breath.  You have any kind of trauma, such as from a fall or a car accident. Summary  The first trimester of pregnancy is from week 1 until the end of week 13 (months 1 through 3).  To take care of yourself and your unborn baby, you will need to eat healthy meals, take medicines only if your doctor tells you to do so, and do activities that are safe for you and your baby.  Keep all follow-up visits as told by your doctor. This is important as your doctor will have to ensure that your baby is healthy and growing well. This information is not intended to replace advice given to you by your health care provider. Make sure you discuss any questions you have with your health care provider. Document Released: 05/02/2008 Document Revised: 11/22/2016 Document Reviewed: 11/22/2016 Elsevier Interactive Patient Education  2017 [employer]  

## 2018-10-03 LAB — HIV ANTIBODY (ROUTINE TESTING W REFLEX): HIV SCREEN 4TH GENERATION: NONREACTIVE

## 2018-10-03 LAB — RUBELLA SCREEN

## 2018-10-03 LAB — ANTIBODY SCREEN: Antibody Screen: NEGATIVE

## 2018-10-03 LAB — ABO AND RH: Rh Factor: POSITIVE

## 2018-10-03 LAB — RPR: RPR: NONREACTIVE

## 2018-10-03 LAB — HGB SOLU + RFLX FRAC: SICKLE SOLUBILITY TEST - HGBRFX: NEGATIVE

## 2018-10-03 LAB — VARICELLA ZOSTER ANTIBODY, IGG: Varicella zoster IgG: 768 index (ref >165)

## 2018-10-03 LAB — HEPATITIS B SURFACE ANTIGEN: Hepatitis B Surface Ag: NEGATIVE

## 2018-10-04 ENCOUNTER — Other Ambulatory Visit: Payer: Self-pay | Admitting: Obstetrics and Gynecology

## 2018-10-04 DIAGNOSIS — Z2839 Other underimmunization status: Secondary | ICD-10-CM

## 2018-10-04 DIAGNOSIS — O09899 Supervision of other high risk pregnancies, unspecified trimester: Secondary | ICD-10-CM | POA: Insufficient documentation

## 2018-10-04 DIAGNOSIS — O9989 Other specified diseases and conditions complicating pregnancy, childbirth and the puerperium: Principal | ICD-10-CM

## 2018-10-04 DIAGNOSIS — Z283 Underimmunization status: Secondary | ICD-10-CM | POA: Insufficient documentation

## 2018-10-09 ENCOUNTER — Other Ambulatory Visit: Payer: Self-pay | Admitting: *Deleted

## 2018-10-09 MED ORDER — ONDANSETRON 4 MG PO TBDP: 4.0000 mg | ORAL_TABLET | Freq: Three times a day (TID) | ORAL | 2 refills | Status: DC | PRN

## 2018-10-31 ENCOUNTER — Encounter: Payer: Self-pay | Admitting: Certified Nurse Midwife

## 2018-10-31 ENCOUNTER — Encounter: Payer: Medicare Other | Admitting: Obstetrics and Gynecology

## 2018-11-13 ENCOUNTER — Other Ambulatory Visit (HOSPITAL_COMMUNITY)
Admission: RE | Admit: 2018-11-13 | Discharge: 2018-11-13 | Disposition: A | Discharge status: Home / Self Care | Payer: Medicare Other | Source: Ambulatory Visit | Attending: Obstetrics and Gynecology | Admitting: Obstetrics and Gynecology

## 2018-11-13 ENCOUNTER — Encounter: Payer: Self-pay | Admitting: Certified Nurse Midwife

## 2018-11-13 ENCOUNTER — Ambulatory Visit (INDEPENDENT_AMBULATORY_CARE_PROVIDER_SITE_OTHER): Payer: Medicaid Other | Admitting: Certified Nurse Midwife

## 2018-11-13 VITALS — BP 104/54 | HR 77 | Wt 139.5 lb

## 2018-11-13 DIAGNOSIS — O34219 Maternal care for unspecified type scar from previous cesarean delivery: Secondary | ICD-10-CM

## 2018-11-13 DIAGNOSIS — Z3A13 13 weeks gestation of pregnancy: Secondary | ICD-10-CM

## 2018-11-13 DIAGNOSIS — Z98891 History of uterine scar from previous surgery: Secondary | ICD-10-CM

## 2018-11-13 DIAGNOSIS — Z3490 Encounter for supervision of normal pregnancy, unspecified, unspecified trimester: Secondary | ICD-10-CM | POA: Insufficient documentation

## 2018-11-13 DIAGNOSIS — Z3491 Encounter for supervision of normal pregnancy, unspecified, first trimester: Secondary | ICD-10-CM
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
## 2018-11-14 LAB — CBC
HEMATOCRIT: 31.1 % — AB (ref 34.0–46.6)
Hemoglobin: 10 g/dL — ABNORMAL LOW (ref 11.1–15.9)
MCH: 27.1 pg (ref 26.6–33.0)
MCHC: 32.2 g/dL (ref 31.5–35.7)
MCV: 84 fL (ref 79–97)
PLATELETS: 225 10*3/uL (ref 150–450)
RBC: 3.69 x10E6/uL — AB (ref 3.77–5.28)
RDW: 14 % (ref 12.3–15.4)
WBC: 8.2 10*3/uL (ref 3.4–10.8)

## 2018-11-19 LAB — CYTOLOGY - PAP: Diagnosis: NEGATIVE

## 2018-11-25 ENCOUNTER — Other Ambulatory Visit: Payer: Self-pay

## 2018-11-25 ENCOUNTER — Encounter: Payer: Self-pay | Admitting: Emergency Medicine

## 2018-11-25 ENCOUNTER — Emergency Department: Payer: Medicare Other

## 2018-11-25 DIAGNOSIS — W010XXA Fall on same level from slipping, tripping and stumbling without subsequent striking against object, initial encounter: Secondary | ICD-10-CM | POA: Insufficient documentation

## 2018-11-25 DIAGNOSIS — Y92002 Bathroom of unspecified non-institutional (private) residence single-family (private) house as the place of occurrence of the external cause: Secondary | ICD-10-CM | POA: Insufficient documentation

## 2018-11-25 DIAGNOSIS — R109 Unspecified abdominal pain: Secondary | ICD-10-CM | POA: Diagnosis not present

## 2018-11-25 DIAGNOSIS — O208 Other hemorrhage in early pregnancy: Secondary | ICD-10-CM | POA: Diagnosis present

## 2018-11-25 DIAGNOSIS — Y9389 Activity, other specified: Secondary | ICD-10-CM | POA: Diagnosis not present

## 2018-11-25 DIAGNOSIS — Y998 Other external cause status: Secondary | ICD-10-CM | POA: Diagnosis not present

## 2018-11-25 DIAGNOSIS — Z3A13 13 weeks gestation of pregnancy: Secondary | ICD-10-CM | POA: Diagnosis not present

## 2018-11-25 NOTE — ED Notes (Signed)
Attempted to assess fetal heart tones but unable to locate.

## 2018-11-25 NOTE — ED Triage Notes (Signed)
Pt reports she is approximately [redacted] weeks pregnant. Last night she slipped on the wet bathroom floor and fell hitting her abd on the side of the tub. Pt reports difficult to take a deep breath. Pt talking in full and complete sentences with no difficulty at this time.

## 2018-11-26 ENCOUNTER — Encounter: Payer: Self-pay | Admitting: *Deleted

## 2018-11-26 ENCOUNTER — Telehealth: Payer: Self-pay | Admitting: Certified Nurse Midwife

## 2018-11-26 ENCOUNTER — Emergency Department
Admission: EM | Admit: 2018-11-26 | Discharge: 2018-11-26 | Disposition: A | Discharge status: Home / Self Care | Payer: Medicare Other | Attending: Emergency Medicine | Admitting: Emergency Medicine

## 2018-11-26 DIAGNOSIS — W19XXXA Unspecified fall, initial encounter: Secondary | ICD-10-CM

## 2018-11-26 DIAGNOSIS — O208 Other hemorrhage in early pregnancy: Secondary | ICD-10-CM | POA: Diagnosis not present

## 2018-11-26 DIAGNOSIS — O418X11 Other specified disorders of amniotic fluid and membranes, first trimester, fetus 1: Secondary | ICD-10-CM

## 2018-11-26 DIAGNOSIS — O468X1 Other antepartum hemorrhage, first trimester: Secondary | ICD-10-CM

## 2018-11-26 MED ORDER — OXYCODONE-ACETAMINOPHEN 5-325 MG PO TABS
1.0000 | ORAL_TABLET | Freq: Once | ORAL | Status: AC
  Administered 2018-11-26: 1 via ORAL
  Filled 2018-11-26: qty 1

## 2018-11-26 MED ORDER — OXYCODONE-ACETAMINOPHEN 5-325 MG PO TABS: 1.0000 | ORAL_TABLET | ORAL | 0 refills | Status: DC | PRN

## 2018-11-26 NOTE — Discharge Instructions (Signed)
Fortunately today your ultrasound was largely reassuring.  Please maintain strict pelvic rest with no sex, no tampons, and no sex toys until you can follow-up with your OB gynecologist in 2 days for recheck.  I also want you to limit the amount of strenuous activity and no heavy lifting at all until that follow-up appointment.  Return to the emergency department sooner for any concerns.  It was a pleasure to take care of you today, and thank you for coming to our emergency department.  If you have any questions or concerns before leaving please ask the nurse to grab me and I'm more than happy to go through your aftercare instructions again.  If you have any concerns once you are home that you are not improving or are in fact getting worse before you can make it to your follow-up appointment, please do not hesitate to call 911 and come back for further evaluation.  Merrily BrittleNeil Rada Zegers, MD   [place] Ob Comp Less 14 Wks  Result Date: 11/26/2018 CLINICAL DATA:  27 year old pregnant female with reported fall. EDC by LMP: 05/21/2019, projecting to an expected gestational age of [redacted] weeks 5 days EXAM: OBSTETRIC <14 WK ULTRASOUND TECHNIQUE: Transabdominal ultrasound was performed for evaluation of the gestation as well as the maternal uterus and adnexal regions. COMPARISON:  09/18/2018 obstetric scan and outside 10/02/2018 obstetric scan. FINDINGS: Intrauterine gestational sac: Single intrauterine gestational sac. Yolk sac:  Not Visualized. Fetus: Visualized. Fetal anatomy not assessed at this early gestational age. Fetal Cardiac Activity: Visualized. Fetal Heart Rate: 152 bpm CRL:   76.0 mm   13 w 5 d                  US EDC: 05/28/2019 Subchorionic hemorrhage: Small anterior perigestational hypoechoic focus measuring 2.1 x 1.0 x 3.5 cm, involving less than 30% of the gestational sac circumference. Maternal uterus/adnexae: No uterine fibroids. Right ovary measures 2.6 x 1.3 x 1.5 cm. Left ovary not visualized. No adnexal  masses. No abnormal free fluid in the pelvis. IMPRESSION: 1. Single living intrauterine gestation at 13 weeks 5 days by crown-rump length, compared to the expected gestational age of [redacted] weeks 5 days by provided menstrual dating. 2. Anterior perigestational hypoechoic focus, potentially a small perigestational bleed. Normal fetal cardiac activity. Consider follow-up obstetric scan in 4 weeks or earlier as clinically warranted. Electronically Signed   By: Delbert PhenixJason A Poff M.D.   On: 11/26/2018 00:36

## 2018-11-26 NOTE — Telephone Encounter (Signed)
The patient called and stated that she would like to speak with a nurse to help go over her recent results she received on MyChart, The patient is having difficulty understanding the  terms. Please advise.
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
## 2018-11-26 NOTE — ED Notes (Signed)
Prior to administration of percocet that she will need a ride home and cannot drive for 4 hours. Pt states she will get a ride home.

## 2018-11-26 NOTE — ED Notes (Signed)
Pt continues to wait to speak with md.

## 2018-11-26 NOTE — ED Notes (Signed)
Pt waiting to speak with dr. Lamont Snowballrifenbark prior to discharge.

## 2018-11-26 NOTE — ED Notes (Signed)
Pt states she fell last night on a wet floor striking right side including right side of head. Pt complains of right sided abd pain, difficulty taking a deep breath. Pt is currently drinking a soda and eating cookies in no acute distress. Pt denies vaginal bleeding, vaginal discharge. Pt is [redacted] weeks pregnant.

## 2018-11-28 NOTE — L&D Delivery Note (Signed)
Delivery Summary for Renee [name]  Labor Events:   Preterm labor:   Rupture date:   Rupture time:   Rupture type: Intact  Fluid Color:   Induction:   Augmentation:   Complications:   Cervical ripening:          Delivery:   Episiotomy:   Lacerations:   Repair suture:   Repair # of packets:   Blood loss (ml): 300 ml   Information for the patient's newborn:  Ruchy, Wildrick [[id number]]    Delivery 05/15/2019 4:59 PM by  C-Section, Low Transverse Sex:  female Gestational Age: [redacted]w[redacted]d Delivery Clinician:   Living?:         APGARS  One minute Five minutes Ten minutes  Skin color:        Heart rate:        Grimace:        Muscle tone:        Breathing:        Totals: 8  9      Presentation/position:      Resuscitation:   Cord information:    Disposition of cord blood:     Blood gases sent?  Complications:   Placenta: Delivered:       appearance Newborn Measurements: Weight: 6 lb 11.9 oz (3060 g)  Height:    Head circumference:    Chest circumference:    Other providers:    Additional  information: Forceps:   Vacuum:   Breech:   Observed anomalies        See Dr. Andreas Blower operative note for details of C-section procedure.    Rubie Maid, MD [facility]

## 2018-11-29 ENCOUNTER — Ambulatory Visit (INDEPENDENT_AMBULATORY_CARE_PROVIDER_SITE_OTHER): Payer: Medicare Other | Admitting: Certified Nurse Midwife

## 2018-11-29 VITALS — BP 103/61 | HR 72 | Wt 141.3 lb

## 2018-11-29 DIAGNOSIS — O418X20 Other specified disorders of amniotic fluid and membranes, second trimester, not applicable or unspecified: Secondary | ICD-10-CM

## 2018-11-29 DIAGNOSIS — O468X2 Other antepartum hemorrhage, second trimester: Secondary | ICD-10-CM | POA: Insufficient documentation

## 2018-11-29 LAB — POCT URINALYSIS DIPSTICK OB
Bilirubin, UA: NEGATIVE
Glucose, UA: NEGATIVE
Ketones, UA: NEGATIVE
LEUKOCYTES UA: NEGATIVE
NITRITE UA: NEGATIVE
PROTEIN: NEGATIVE
RBC UA: NEGATIVE
Spec Grav, UA: <=1.005 — AB (ref 1.010–1.025)
Urobilinogen, UA: 0.2 E.U./dL
pH, UA: 5 (ref 5.0–8.0)

## 2018-11-29 NOTE — Progress Notes (Signed)
Patient here to follow-up after ER visit 12/30 due to fall, still having difficulty breathing and intermittent sharp right side abdominal pain that goes into her back.  Patient c/o occasional itching and swelling in hands and feet "for a couple of years".

## 2018-11-29 NOTE — Patient Instructions (Signed)
Subchorionic Hematoma  A subchorionic hematoma is a gathering of blood between the outer wall of the embryo (chorion) and the inner wall of the womb (uterus). This condition can cause vaginal bleeding. If they cause little or no vaginal bleeding, early small hematomas usually shrink on their own and do not affect your baby or pregnancy. When bleeding starts later in pregnancy, or if the hematoma is larger or occurs in older pregnant women, the condition may be more serious. Larger hematomas may get bigger, which increases the chances of miscarriage. This condition also increases the risk of:  Premature separation of the placenta from the uterus.  Premature (preterm) labor.  Stillbirth. What are the causes? The exact cause of this condition is not known. It occurs when blood is trapped between the placenta and the uterine wall because the placenta has separated from the original site of implantation. What increases the risk? You are more likely to develop this condition if:  You were treated with fertility medicines.  You conceived through in vitro fertilization (IVF). What are the signs or symptoms? Symptoms of this condition include:  Vaginal spotting or bleeding.  Contractions of the uterus. These cause abdominal pain. Sometimes you may have no symptoms and the bleeding may only be seen when ultrasound images are taken (transvaginal ultrasound). How is this diagnosed? This condition is diagnosed based on a physical exam. This includes a pelvic exam. You may also have other tests, including:  Blood tests.  Urine tests.  Ultrasound of the abdomen. How is this treated? Treatment for this condition can vary. Treatment may include:  Watchful waiting. You will be monitored closely for any changes in bleeding. During this stage: ? The hematoma may be reabsorbed by the body. ? The hematoma may separate the fluid-filled space containing the embryo (gestational sac) from the wall of the  womb (endometrium).  Medicines.  Activity restriction. This may be needed until the bleeding stops. Follow these instructions at home:  Stay on bed rest if told to do so by your health care provider.  Do not lift anything that is heavier than 10 lbs. (4.5 kg) or as told by your health care provider.  Do not use any products that contain nicotine or tobacco, such as cigarettes and e-cigarettes. If you need help quitting, ask your health care provider.  Track and write down the number of pads you use each day and how soaked (saturated) they are.  Do not use tampons.  Keep all follow-up visits as told by your health care provider. This is important. Your health care provider may ask you to have follow-up blood tests or ultrasound tests or both. Contact a health care provider if:  You have any vaginal bleeding.  You have a fever. Get help right away if:  You have severe cramps in your stomach, back, abdomen, or pelvis.  You pass large clots or tissue. Save any tissue for your health care provider to look at.  You have more vaginal bleeding, and you faint or become lightheaded or weak. Summary  A subchorionic hematoma is a gathering of blood between the outer wall of the placenta and the uterus.  This condition can cause vaginal bleeding.  Sometimes you may have no symptoms and the bleeding may only be seen when ultrasound images are taken.  Treatment may include watchful waiting, medicines, or activity restriction. This information is not intended to replace advice given to you by your health care provider. Make sure you discuss any questions you   have with your health care provider. Document Released: 03/01/2007 Document Revised: 01/10/2017 Document Reviewed: 01/10/2017 Elsevier Interactive Patient Education  2019 Elsevier Inc.   Round Ligament Pain  The round ligament is a cord of muscle and tissue that helps support the uterus. It can become a source of pain during  pregnancy if it becomes stretched or twisted as the baby grows. The pain usually begins in the second trimester (13-28 weeks) of pregnancy, and it can come and go until the baby is delivered. It is not a serious problem, and it does not cause harm to the baby. Round ligament pain is usually a short, sharp, and pinching pain, but it can also be a dull, lingering, and aching pain. The pain is felt in the lower side of the abdomen or in the groin. It usually starts deep in the groin and moves up to the outside of the hip area. The pain may occur when you:  Suddenly change position, such as quickly going from a sitting to standing position.  Roll over in bed.  Cough or sneeze.  Do physical activity. Follow these instructions at home:   Watch your condition for any changes.  When the pain starts, relax. Then try any of these methods to help with the pain: ? Sitting down. ? Flexing your knees up to your abdomen. ? Lying on your side with one pillow under your abdomen and another pillow between your legs. ? Sitting in a warm bath for 15-20 minutes or until the pain goes away.  Take over-the-counter and prescription medicines only as told by your health care provider.  Move slowly when you sit down or stand up.  Avoid long walks if they cause pain.  Stop or reduce your physical activities if they cause pain.  Keep all follow-up visits as told by your health care provider. This is important. Contact a health care provider if:  Your pain does not go away with treatment.  You feel pain in your back that you did not have before.  Your medicine is not helping. Get help right away if:  You have a fever or chills.  You develop uterine contractions.  You have vaginal bleeding.  You have nausea or vomiting.  You have diarrhea.  You have pain when you urinate. Summary  Round ligament pain is felt in the lower abdomen or groin. It is usually a short, sharp, and pinching pain. It can  also be a dull, lingering, and aching pain.  This pain usually begins in the second trimester (13-28 weeks). It occurs because the uterus is stretching with the growing baby, and it is not harmful to the baby.  You may notice the pain when you suddenly change position, when you cough or sneeze, or during physical activity.  Relaxing, flexing your knees to your abdomen, lying on one side, or taking a warm bath may help to get rid of the pain.  Get help from your health care provider if the pain does not go away or if you have vaginal bleeding, nausea, vomiting, diarrhea, or painful urination. This information is not intended to replace advice given to you by your health care provider. Make sure you discuss any questions you have with your health care provider. Document Released: 08/23/2008 Document Revised: 05/02/2018 Document Reviewed: 05/02/2018 Elsevier Interactive Patient Education  2019 Elsevier Inc. Back Pain in Pregnancy Back pain during pregnancy is common. Back pain may be caused by several factors that are related to changes during your pregnancy.  Follow these instructions at home: Managing pain, stiffness, and swelling      If directed, for sudden (acute) back pain, put ice on the painful area. ? Put ice in a plastic bag. ? Place a towel between your skin and the bag. ? Leave the ice on for 20 minutes, 2-3 times per day.  If directed, apply heat to the affected area before you exercise. Use the heat source that your health care provider recommends, such as a moist heat pack or a heating pad. ? Place a towel between your skin and the heat source. ? Leave the heat on for 20-30 minutes. ? Remove the heat if your skin turns bright red. This is especially important if you are unable to feel pain, heat, or cold. You may have a greater risk of getting burned.  If directed, massage the affected area. Activity  Exercise as told by your health care provider. Gentle exercise is the  best way to prevent or manage back pain.  Listen to your body when lifting. If lifting hurts, ask for help or bend your knees. This uses your leg muscles instead of your back muscles.  Squat down when picking up something from the floor. Do not bend over.  Only use bed rest for short periods as told by your health care provider. Bed rest should only be used for the most severe episodes of back pain. Standing, sitting, and lying down  Do not stand in one place for long periods of time.  Use good posture when sitting. Make sure your head rests over your shoulders and is not hanging forward. Use a pillow on your lower back if necessary.  Try sleeping on your side, preferably the left side, with a pregnancy support pillow or 1-2 regular pillows between your legs. ? If you have back pain after a night's rest, your bed may be too soft. ? A firm mattress may provide more support for your back during pregnancy. General instructions  Do not wear high heels.  Eat a healthy diet. Try to gain weight within your health care provider's recommendations.  Use a maternity girdle, elastic sling, or back brace as told by your health care provider.  Take over-the-counter and prescription medicines only as told by your health care provider.  Work with a physical therapist or massage therapist to find ways to manage back pain. Acupuncture or massage therapy may be helpful.  Keep all follow-up visits as told by your health care provider. This is important. Contact a health care provider if:  Your back pain interferes with your daily activities.  You have increasing pain in other parts of your body. Get help right away if:  You develop numbness, tingling, weakness, or problems with the use of your arms or legs.  You develop severe back pain that is not controlled with medicine.  You have a change in bowel or bladder control.  You develop shortness of breath, dizziness, or you faint.  You develop  nausea, vomiting, or sweating.  You have back pain that is a rhythmic, cramping pain similar to labor pains. Labor pain is usually 1-2 minutes apart, lasts for about 1 minute, and involves a bearing down feeling or pressure in your pelvis.  You have back pain and your water breaks or you have vaginal bleeding.  You have back pain or numbness that travels down your leg.  Your back pain developed after you fell.  You develop pain on one side of your back.  You  see blood in your urine.  You develop skin blisters in the area of your back pain. Summary  Back pain may be caused by several factors that are related to changes during your pregnancy.  Follow instructions as told by your health care provider for managing pain, stiffness, and swelling.  Exercise as told by your health care provider. Gentle exercise is the best way to prevent or manage back pain.  Take over-the-counter and prescription medicines only as told by your health care provider.  Keep all follow-up visits as told by your health care provider. This is important. This information is not intended to replace advice given to you by your health care provider. Make sure you discuss any questions you have with your health care provider. Document Released: 02/22/2006 Document Revised: 05/02/2018 Document Reviewed: 05/02/2018 Elsevier Interactive Patient Education  2019 [employer]

## 2018-11-30 NOTE — Progress Notes (Signed)
Subjective:   Renee [name] is a 28 y.o. [id number] [redacted]w[redacted]d being seen today for ER follow up visit. Seen at [facility] ER after fall over bathtub while cleaning. Diagnosed with bruised ribs and subchorionic hemorrhage.   Patient reports intermittent difficulty breathing and intermittent sharp right sided pain that radiates to her back.   Also reports occasional itching and swelling in hands and feet for the last "couple of years".   Patient and spouse state that she is "almost out of percocet".    No contractions, vaginal bleeding or leaking of fluid.   Denies respiratory distress, chest pain, abdominal pain, vaginal bleeding, dysuria, and leg pain or swelling.   The following portions of the patient's history were reviewed and updated as appropriate: allergies, current medications, past family history, past medical history, past social history, past surgical history and problem list.   Review of Systems:   ROS negative except as noted above. Information obtained from patient and spouse.   Objective:   BP 103/61   Pulse 72   Wt 141 lb 4.8 oz (64.1 kg)   LMP 08/16/2018 (Approximate)   BMI 25.03 kg/m   FHT: Fetal Heart Rate (bpm): 143  Uterine Size: Fundal Height: 16 cm  Fetal Movement: Movement: Present   Abdomen:  soft, gravid, appropriate for gestational age,non-tender   Assessment:   Pregnancy:  G3P2002 at [redacted]w[redacted]d  1. Subchorionic hematoma in second trimester, single or unspecified fetus  - POC Urinalysis Dipstick OB  2. Status post fall  Plan:   Education regarding home treatment measures.   Preterm labor symptoms: vaginal bleeding, contractions and leaking of fluid reviewed in detail.  Fetal movement precautions reviewed.  Follow up as previously scheduled.   Gunnar Bulla, CNM [facility], [facility]

## 2018-12-10 ENCOUNTER — Encounter: Payer: Medicare Other | Admitting: Certified Nurse Midwife

## 2018-12-10 ENCOUNTER — Encounter: Payer: Self-pay | Admitting: Certified Nurse Midwife

## 2018-12-24 ENCOUNTER — Other Ambulatory Visit: Payer: Self-pay | Admitting: Certified Nurse Midwife

## 2018-12-24 DIAGNOSIS — Z3689 Encounter for other specified antenatal screening: Secondary | ICD-10-CM

## 2018-12-24 DIAGNOSIS — Z3492 Encounter for supervision of normal pregnancy, unspecified, second trimester: Secondary | ICD-10-CM

## 2018-12-26 ENCOUNTER — Other Ambulatory Visit: Payer: Medicare Other

## 2018-12-26 ENCOUNTER — Encounter: Payer: Medicare Other | Admitting: Certified Nurse Midwife

## 2019-01-01 ENCOUNTER — Ambulatory Visit (INDEPENDENT_AMBULATORY_CARE_PROVIDER_SITE_OTHER): Payer: Medicare Other | Admitting: Certified Nurse Midwife

## 2019-01-01 ENCOUNTER — Ambulatory Visit (INDEPENDENT_AMBULATORY_CARE_PROVIDER_SITE_OTHER): Payer: Medicare Other

## 2019-01-01 ENCOUNTER — Encounter: Payer: Self-pay | Admitting: Certified Nurse Midwife

## 2019-01-01 VITALS — BP 97/53 | HR 70 | Wt 145.4 lb

## 2019-01-01 DIAGNOSIS — Z363 Encounter for antenatal screening for malformations: Secondary | ICD-10-CM | POA: Diagnosis not present

## 2019-01-01 DIAGNOSIS — Z3492 Encounter for supervision of normal pregnancy, unspecified, second trimester: Secondary | ICD-10-CM

## 2019-01-01 NOTE — Patient Instructions (Signed)
Prueba de tolerancia a la glucosa durante el embarazo Glucose Tolerance Test During Pregnancy Por qu me debo realizar esta prueba? La prueba de tolerancia a la glucosa (PTG) se realiza para controlar la manera en que el cuerpo procesa el azcar (glucosa). Esta es una de las diferentes pruebas que se usan para diagnosticar la diabetes que se desarrolla durante el embarazo (diabetes mellitus gestacional). La diabetes gestacional es una forma temporal de diabetes que algunas mujeres desarrollan durante el embarazo. Generalmente, se produce durante el segundo trimestre del embarazo y desaparece despus del parto. Los anlisis (pruebas de deteccin) para la diabetes gestacional por lo general se hacen entre las 24 y 28 semanas de embarazo. Se le podr realizar la prueba PTG despus de realizarse una prueba de deteccin de glucosa de 1 hora si los resultados de esa prueba indican que es posible que tenga diabetes gestacional. Tambin pueden hacerle esta prueba si:  Tiene antecedentes de diabetes gestacional.  Tiene antecedentes de haber parido bebs muy grandes o antecedentes de prdida fetal repetida (muerte fetal).  Tiene signos y sntomas de diabetes, tales como: ? Cambios en la visin. ? Hormigueo o adormecimiento en las manos o los pies. ? Cambios en el hambre, la sed y la miccin que no se explican por el embarazo. Qu se analiza? Esta prueba mide la cantidad de glucosa en la sangre en diferentes momentos durante un perodo de 3horas. Esto indica qu tan bien su cuerpo puede procesar la glucosa. Qu tipo de muestra se toma?  Para esta prueba, se extraen muestras de sangre. Por lo general, para extraerlas, se introduce una aguja en un vaso sanguneo. Cmo debo prepararme para esta prueba?  Durante 3 das antes de la prueba, coma normalmente. Coma muchos alimentos con alto contenido de carbohidratos.  Siga las indicaciones del mdico acerca de lo siguiente: ? Restricciones en la comida o  la bebida el da de la prueba. Se le podr pedir que no coma ni beba nada ms que agua (ayuno) desde 8 a 10 horas antes de la prueba. ? Cambiar o suspender los medicamentos que toma habitualmente. Algunos medicamentos pueden interferir en esta prueba. Informe al mdico acerca de lo siguiente:  Todos los medicamentos que utiliza, incluidos vitaminas, hierbas, gotas oftlmicas, cremas y medicamentos de venta libre.  Cualquier enfermedad de la sangre que tenga.  Cirugas a las que se someti.  Cualquier enfermedad que tenga. Qu ocurre durante la prueba? Primero se le medir la glucemia. Esto se denomina glucemia en ayunas, ya que usted hizo ayuno antes de la prueba. Luego beber una solucin de glucosa que contiene una cierta cantidad de glucosa. Se le medir la glucosa en sangre nuevamente 1, 2 y 3 horas despus de beber la solucin. La realizacin de esta prueba lleva alrededor de 3 horas. Durante ese tiempo deber permanecer en el lugar donde se realiza la prueba. Durante el perodo de la prueba:  No coma ni beba nada que no sea la solucin de glucosa.  No haga ejercicios.  No consuma ningn producto que contenga nicotina o tabaco, como cigarrillos y cigarrillos electrnicos. Si necesita ayuda para dejar estos productos, consulte a su mdico. El procedimiento de prueba puede variar segn el mdico y el hospital. Cmo se informan los resultados? Sus resultados se informarn como miligramos de glucosa por decilitro de sangre (mg/dl) o milimoles por litro (mmol/l). El mdico comparar sus resultados con los rangos normales que se establecieron despus de realizar el anlisis a un grupo grande de personas (rangos   de referencia). Los rangos de referencia pueden variar entre laboratorios y hospitales. Los rangos de referencia habituales para esta prueba son los siguientes:  En ayunas: menos de 95 a 105mg /dl (5,3 a 2,9JMEQ/A).  1 hora despus de beber glucosa: menos de 180 a 190mg /dl (83,4 a  19,6QIWL/N).  2 horas despus de beber glucosa: menos de 155 a 165mg /dl (8,6 a 9,8XQJJ/H).  3 horas despus de beber glucosa: de 140 a 145mg /dl (7,8 a 4,1DEYC/X). Qu significan los resultados? Los [employer] de los rangos de Mining engineer se Designer, fashion/clothing, lo que significa que sus niveles de glucosa estn bien controlados. Si dos o ms de sus niveles de glucemia estn altos, es posible que le diagnostiquen diabetes gestacional. Si solo un nivel est alto, el mdico podr sugerir repetir Ambulance person o hacer otras pruebas para Astronomer un diagnstico. Hable con el mdico sobre lo que significan los [place]. Preguntas para hacerle al mdico Consulte a su mdico o pregunte en el departamento donde se realiza la prueba acerca de lo siguiente:  Cundo estarn disponibles mis resultados?  Cmo obtendr mis resultados?  Cules son mis opciones de tratamiento?  Qu otras pruebas necesito?  Cules son los prximos pasos que debo seguir? Resumen  La prueba de tolerancia a la glucosa (PTG) es una de las diferentes pruebas que se usan para diagnosticar la diabetes que se desarrolla durante el embarazo (diabetes mellitus gestacional). La diabetes gestacional es una forma temporal de diabetes que algunas mujeres desarrollan durante el embarazo.  Se le podr realizar la prueba PTG despus de realizarse una prueba de deteccin de glucosa de 1 hora si los resultados de esa prueba indican que es posible que tenga diabetes gestacional. Tambin se le podr realizar esta prueba si tiene algn sntoma o factor de riesgo de diabetes gestacional.  Hable con el mdico sobre lo que significan los [place]. Esta informacin no tiene Theme park manager el consejo del mdico. Asegrese de hacerle al mdico cualquier pregunta que tenga. Document Released: 05/15/2012 Document Revised: 09/12/2017 Document Reviewed: 09/12/2017 Elsevier Interactive Patient Education  2019 [employer].

## 2019-01-01 NOTE — Progress Notes (Signed)
ROB doing well. No complaints. Anatomy scan today. Scan incompleted. Rt choroid plexus cysts noted. Findings reviewed with pt. Hand out given on choroid plexus cysts. Pt verbalizes understanding. She will return in 2-3 wks to complete anatomy scan and 4 wks with Melody for ROB.   Doreene Burke, CNM   Patient Name: Renee [name] DOB: [birth year] MRN: [id number]  ULTRASOUND REPORT  Location: [facility] Date of Service: 01/01/2019   Indications:Anatomy Ultrasound Findings:  Mason Jim intrauterine pregnancy is visualized with FHR at 135 BPM. Biometrics give an (U/S) Gestational age of [redacted]w[redacted]d and an (U/S) EDD of 05/24/19; this correlates with the clinically established Estimated Date of Delivery: 05/21/19  Fetal presentation is transverse. Fetal head to maternal Lt EFW: wl. Placenta: posterior. Grade: 0. Retroplacental uterine contraction is seen  AFI: subjectively normal.  Anatomic survey is incomplete for heart,3VC, bladder d/t fetal position. Rt choroid plexus has a simple cyst = 9.1x 8.39mm .; Gender - female.    Right Ovary is normal in appearance. Left Ovary is normal appearance. Survey of the adnexa demonstrates no adnexal masses. There is no free peritoneal fluid in the cul de sac.  Impression: 1. [redacted]w[redacted]d Viable Singleton Intrauterine pregnancy by U/S. 2. (U/S) EDD is consistent with Clinically established Estimated Date of Delivery: 05/21/19 . 3. Normal Anatomy Scan ,   is incomplete for heart,3VC, bladder d/t fetal position. Rt choroid plexus has a simple cyst = 9.1x 8.25mm   Recommendations: 1.Clinical correlation with the patient's History and Physical Exam. 2.   Augustine Radar, RDMS

## 2019-01-15 ENCOUNTER — Ambulatory Visit (INDEPENDENT_AMBULATORY_CARE_PROVIDER_SITE_OTHER): Payer: Medicare Other

## 2019-01-15 DIAGNOSIS — Z362 Encounter for other antenatal screening follow-up: Secondary | ICD-10-CM | POA: Diagnosis not present

## 2019-01-15 DIAGNOSIS — Z3492 Encounter for supervision of normal pregnancy, unspecified, second trimester: Secondary | ICD-10-CM

## 2019-01-16 ENCOUNTER — Other Ambulatory Visit: Payer: Medicare Other

## 2019-01-29 ENCOUNTER — Other Ambulatory Visit: Payer: Self-pay

## 2019-01-29 ENCOUNTER — Ambulatory Visit (INDEPENDENT_AMBULATORY_CARE_PROVIDER_SITE_OTHER): Payer: Medicare Other | Admitting: Obstetrics and Gynecology

## 2019-01-29 VITALS — BP 89/46 | HR 74 | Wt 150.0 lb

## 2019-01-29 DIAGNOSIS — O9989 Other specified diseases and conditions complicating pregnancy, childbirth and the puerperium: Secondary | ICD-10-CM | POA: Insufficient documentation

## 2019-01-29 DIAGNOSIS — R200 Anesthesia of skin: Secondary | ICD-10-CM | POA: Diagnosis not present

## 2019-01-29 DIAGNOSIS — O1202 Gestational edema, second trimester: Secondary | ICD-10-CM | POA: Insufficient documentation

## 2019-01-29 DIAGNOSIS — O350XX0 Maternal care for (suspected) central nervous system malformation in fetus, not applicable or unspecified: Secondary | ICD-10-CM

## 2019-01-29 DIAGNOSIS — Z3A24 24 weeks gestation of pregnancy: Secondary | ICD-10-CM | POA: Insufficient documentation

## 2019-01-29 DIAGNOSIS — Z3492 Encounter for supervision of normal pregnancy, unspecified, second trimester: Secondary | ICD-10-CM

## 2019-01-29 DIAGNOSIS — O1200 Gestational edema, unspecified trimester: Secondary | ICD-10-CM

## 2019-01-29 DIAGNOSIS — O3503X0 Maternal care for (suspected) central nervous system malformation or damage in fetus, choroid plexus cysts, not applicable or unspecified: Secondary | ICD-10-CM

## 2019-01-29 LAB — POCT URINALYSIS DIPSTICK OB
Blood, UA: NEGATIVE
GLUCOSE, UA: NEGATIVE
KETONES UA: 40
Nitrite, UA: NEGATIVE
Spec Grav, UA: 1.015 (ref 1.010–1.025)
Urobilinogen, UA: 0.2 E.U./dL
pH, UA: 6.5 (ref 5.0–8.0)

## 2019-01-29 NOTE — Progress Notes (Signed)
ROB- doing well, glucola next visit. Discussed swelling of both feet, recommended compression socks- order sent in. Also will give note to postpone doing community service until not pregnant.will do f/u u/s at nect visit for rt ch.plexus cyst.

## 2019-01-29 NOTE — ED Triage Notes (Addendum)
Pt in with bilat leg and feet swelling since today, left worse then right . Went to [facility] and was told to elevate and rest. Pt here for worsening swelling, edema noted to bilat feet and ankles, co pain to ankles. States does have some numbness to feet. Pt is [redacted] weeks pregnant, denies any other pregnancy problems.

## 2019-01-29 NOTE — Progress Notes (Signed)
ROB- pt is c/o "swollen feet", otherwise she is doing well

## 2019-01-30 ENCOUNTER — Emergency Department
Admission: EM | Admit: 2019-01-30 | Discharge: 2019-01-30 | Disposition: A | Discharge status: Home / Self Care | Payer: Medicare Other | Attending: Emergency Medicine | Admitting: Emergency Medicine

## 2019-01-30 ENCOUNTER — Other Ambulatory Visit: Payer: Self-pay

## 2019-01-30 ENCOUNTER — Telehealth: Payer: Self-pay | Admitting: Obstetrics and Gynecology

## 2019-01-30 ENCOUNTER — Emergency Department: Payer: Medicare Other

## 2019-01-30 ENCOUNTER — Inpatient Hospital Stay
Admission: EM | Admit: 2019-01-30 | Discharge: 2019-01-30 | Disposition: A | Discharge status: Home / Self Care | Payer: Medicare Other | Source: Home / Self Care | Admitting: Obstetrics and Gynecology

## 2019-01-30 ENCOUNTER — Emergency Department
Admission: EM | Admit: 2019-01-30 | Discharge: 2019-01-30 | Disposition: A | Discharge status: Home / Self Care | Payer: Medicare Other | Source: Home / Self Care | Attending: Student in an Organized Health Care Education/Training Program | Admitting: Student in an Organized Health Care Education/Training Program

## 2019-01-30 DIAGNOSIS — M79605 Pain in left leg: Secondary | ICD-10-CM | POA: Insufficient documentation

## 2019-01-30 DIAGNOSIS — O99012 Anemia complicating pregnancy, second trimester: Secondary | ICD-10-CM

## 2019-01-30 DIAGNOSIS — O26892 Other specified pregnancy related conditions, second trimester: Secondary | ICD-10-CM

## 2019-01-30 DIAGNOSIS — Z3A26 26 weeks gestation of pregnancy: Secondary | ICD-10-CM

## 2019-01-30 DIAGNOSIS — Z79899 Other long term (current) drug therapy: Secondary | ICD-10-CM | POA: Insufficient documentation

## 2019-01-30 DIAGNOSIS — R600 Localized edema: Secondary | ICD-10-CM

## 2019-01-30 DIAGNOSIS — R609 Edema, unspecified: Secondary | ICD-10-CM

## 2019-01-30 DIAGNOSIS — M7989 Other specified soft tissue disorders: Secondary | ICD-10-CM

## 2019-01-30 DIAGNOSIS — D649 Anemia, unspecified: Secondary | ICD-10-CM

## 2019-01-30 DIAGNOSIS — O1202 Gestational edema, second trimester: Secondary | ICD-10-CM | POA: Diagnosis not present

## 2019-01-30 HISTORY — DX: Anxiety disorder, unspecified: F41.9

## 2019-01-30 HISTORY — DX: Depression, unspecified: F32.A

## 2019-01-30 HISTORY — DX: Major depressive disorder, single episode, unspecified: F32.9

## 2019-01-30 LAB — URINALYSIS, COMPLETE (UACMP) WITH MICROSCOPIC
Bilirubin Urine: NEGATIVE
Glucose, UA: NEGATIVE mg/dL
Hgb urine dipstick: NEGATIVE
Ketones, ur: 5 mg/dL — AB
Nitrite: NEGATIVE
Protein, ur: NEGATIVE mg/dL
Specific Gravity, Urine: 1.025 (ref 1.005–1.030)
pH: 6 (ref 5.0–8.0)

## 2019-01-30 LAB — CBC WITH DIFFERENTIAL/PLATELET
Abs Immature Granulocytes: 0.05 10*3/uL (ref 0.00–0.07)
Basophils Absolute: 0 10*3/uL (ref 0.0–0.1)
Basophils Relative: 0 %
EOS ABS: 0.1 10*3/uL (ref 0.0–0.5)
EOS PCT: 1 %
HCT: 28.1 % — ABNORMAL LOW (ref 36.0–46.0)
Hemoglobin: 8.7 g/dL — ABNORMAL LOW (ref 12.0–15.0)
Immature Granulocytes: 1 %
Lymphocytes Relative: 21 %
Lymphs Abs: 1.9 10*3/uL (ref 0.7–4.0)
MCH: 25.3 pg — ABNORMAL LOW (ref 26.0–34.0)
MCHC: 31 g/dL (ref 30.0–36.0)
MCV: 81.7 fL (ref 80.0–100.0)
Monocytes Absolute: 0.5 10*3/uL (ref 0.1–1.0)
Monocytes Relative: 5 %
Neutro Abs: 6.6 10*3/uL (ref 1.7–7.7)
Neutrophils Relative %: 72 %
Platelets: 214 10*3/uL (ref 150–400)
RBC: 3.44 MIL/uL — ABNORMAL LOW (ref 3.87–5.11)
RDW: 13.7 % (ref 11.5–15.5)
WBC: 9.2 10*3/uL (ref 4.0–10.5)
nRBC: 0 % (ref 0.0–0.2)

## 2019-01-30 LAB — COMPREHENSIVE METABOLIC PANEL
ALT: 10 U/L (ref 0–44)
AST: 14 U/L — AB (ref 15–41)
Albumin: 3.1 g/dL — ABNORMAL LOW (ref 3.5–5.0)
Alkaline Phosphatase: 46 U/L (ref 38–126)
Anion gap: 6 (ref 5–15)
BUN: 13 mg/dL (ref 6–20)
CO2: 23 mmol/L (ref 22–32)
Calcium: 8.2 mg/dL — ABNORMAL LOW (ref 8.9–10.3)
Chloride: 110 mmol/L (ref 98–111)
Creatinine, Ser: 0.53 mg/dL (ref 0.44–1.00)
GFR calc Af Amer: >60 mL/min (ref >60)
Glucose, Bld: 95 mg/dL (ref 70–99)
POTASSIUM: 3.9 mmol/L (ref 3.5–5.1)
Sodium: 139 mmol/L (ref 135–145)
Total Bilirubin: 0.1 mg/dL — ABNORMAL LOW (ref 0.3–1.2)
Total Protein: 6.2 g/dL — ABNORMAL LOW (ref 6.5–8.1)

## 2019-01-30 LAB — CBC
HCT: 28.8 % — ABNORMAL LOW (ref 36.0–46.0)
Hemoglobin: 8.9 g/dL — ABNORMAL LOW (ref 12.0–15.0)
MCH: 25.4 pg — ABNORMAL LOW (ref 26.0–34.0)
MCHC: 30.9 g/dL (ref 30.0–36.0)
MCV: 82.3 fL (ref 80.0–100.0)
NRBC: 0 % (ref 0.0–0.2)
Platelets: 236 10*3/uL (ref 150–400)
RBC: 3.5 MIL/uL — ABNORMAL LOW (ref 3.87–5.11)
RDW: 13.7 % (ref 11.5–15.5)
WBC: 10.6 10*3/uL — AB (ref 4.0–10.5)

## 2019-01-30 LAB — BASIC METABOLIC PANEL
Anion gap: 7 (ref 5–15)
BUN: 10 mg/dL (ref 6–20)
CO2: 23 mmol/L (ref 22–32)
Calcium: 8.2 mg/dL — ABNORMAL LOW (ref 8.9–10.3)
Chloride: 108 mmol/L (ref 98–111)
Creatinine, Ser: 0.48 mg/dL (ref 0.44–1.00)
GFR calc Af Amer: >60 mL/min (ref >60)
GFR calc non Af Amer: >60 mL/min (ref >60)
Glucose, Bld: 91 mg/dL (ref 70–99)
Potassium: 3.9 mmol/L (ref 3.5–5.1)
SODIUM: 138 mmol/L (ref 135–145)

## 2019-01-30 MED ORDER — IRON (FERROUS SULFATE) 142 (45 FE) MG PO TBCR: 1.0000 | EXTENDED_RELEASE_TABLET | Freq: Two times a day (BID) | ORAL | 0 refills | Status: DC

## 2019-01-30 MED ORDER — FERROUS SULFATE 325 (65 FE) MG PO TABS
325.0000 mg | ORAL_TABLET | Freq: Once | ORAL | Status: AC
  Administered 2019-01-30: 325 mg via ORAL
  Filled 2019-01-30 (×2): qty 1

## 2019-01-30 NOTE — ED Notes (Signed)
No answer when called several times from lobby 

## 2019-01-30 NOTE — Telephone Encounter (Signed)
Called pt she is c/o leg pain, not really from swelling, states she had a headache last night, denies headache now, advised pt [facility] wants her to go to ED, she voiced understanding

## 2019-01-30 NOTE — ED Notes (Signed)
Pharmacy contacted to obtain medication in order to d/c pt

## 2019-01-30 NOTE — OB Triage Note (Signed)
Pt presents to L&D for lower extremity swelling for the past 2 months. Pt states her left leg was "really big and numb." Pt came in last night but states "the wait was too long so I left, and called my doctor today and she told me to come in." Denies any bleeding or LOF. States she has "had cramping in my right side, and its hard to breathe. Reports positive fetal movement. Non-pitting edema noted bilaterally in lower extremeties. Vitals WNL. Will continue to monitor

## 2019-01-30 NOTE — ED Notes (Signed)
Pt was sent from triage today to OB upstairs. OB sent pt back down to ED to r/o cardiomyopathy. No resp distress noted. No SOB or tachypnea.   Pt had blood drawn and urine sent last night.

## 2019-01-30 NOTE — Telephone Encounter (Signed)
Pt is calling again for advise. She prefers to hear from [place] before going to the ED.   Pt is aware that [facility] is at lunch.

## 2019-01-30 NOTE — Telephone Encounter (Signed)
The patient states she hd bad swelling of her feet and left is worse.  She called the oncall provider last night and was told to go to the ER, but the wait was "too long" per patient and she left without being seen.  She states she had a headache last night, but does not have one right now.  She is not leaking fluid and not nauseous.  She does state she is short of breath all the time.  Please advise, thanks.

## 2019-01-30 NOTE — OB Triage Provider Note (Signed)
L&D OB Triage Note  Renee [name] is a 28 y.o. G19P2002 female at [redacted]w[redacted]d, EDD Estimated Date of Delivery: 05/21/19 who presented to triage for complaints of bilateral swelling in lower legs for the last 4-6 weeks that is worse since last night, making her legs hurt. She also reports feeling short of breath with exertion.  She was evaluated by the nurses with no significant findings/findings significant for fetal or labor concerns. Vital signs stable. An NST was performed and has been reviewed by Me. She was reassured and instructed to return to ED for more evaluation..   NST INTERPRETATION: Indications: patient reassurance                   Impression: reactive   Plan: NST performed was reviewed and was found to be reactive. She was discharged home after evaluation in ED.  Continue routine prenatal care. Follow up with OB/GYN as previously scheduled.     Melody Suzan Nailer, CNM

## 2019-01-30 NOTE — ED Triage Notes (Signed)
Pt was just seen in ED last night but LWAT. States bilat leg swelling x 2 months. Slight swelling noted. Pt is [redacted] weeks pregnant. Pt also c/o of R hand "sleeping" randomly while pt is sleeping which improves when she wakes up and moves it. A&O, ambulatory.

## 2019-01-30 NOTE — Discharge Instructions (Addendum)
[facility].  Return for any worsening symptoms or concerns.
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
## 2019-01-30 NOTE — ED Notes (Signed)
FHT 142 bpm 

## 2019-01-30 NOTE — ED Notes (Signed)
Patient transported to Ultrasound 

## 2019-02-25 ENCOUNTER — Telehealth: Payer: Self-pay

## 2019-02-25 NOTE — Telephone Encounter (Signed)
Coronavirus (COVID-19) Are you at risk?  Are you at risk for the Coronavirus (COVID-19)?  To be considered HIGH RISK for Coronavirus (COVID-19), you have to meet the following criteria:  . Traveled to [place], [place], [place], [place] or [place]; or in the [place] to [place], [place], [place], or [place]; and have fever, cough, and shortness of breath within the last 2 weeks of travel OR . Been in close contact with a person diagnosed with COVID-19 within the last 2 weeks and have fever, cough, and shortness of breath . IF YOU DO NOT MEET THESE CRITERIA, YOU ARE CONSIDERED LOW RISK FOR COVID-19.  What to do if you are HIGH RISK for COVID-19?  . If you are having a medical emergency, call 911. . Seek medical care right away. Before you go to a doctor's office, urgent care or emergency department, call ahead and tell them about your recent travel, contact with someone diagnosed with COVID-19, and your symptoms. You should receive instructions from your physician's office regarding next steps of care.  . When you arrive at healthcare provider, tell the healthcare staff immediately you have returned from visiting [place], [place], [place], [place] or [place]; or traveled in the [place] to [place], [place], [place], or [place]; in the last two weeks or you have been in close contact with a person diagnosed with COVID-19 in the last 2 weeks.   . Tell the health care staff about your symptoms: fever, cough and shortness of breath. . After you have been seen by a medical provider, you will be either: o Tested for (COVID-19) and discharged home on quarantine except to seek medical care if symptoms worsen, and asked to  - Stay home and avoid contact with others until you get your results (4-5 days)  - Avoid travel on public transportation if possible (such as bus, train, or airplane) or o Sent to the Emergency Department by EMS for evaluation, COVID-19 testing, and possible  admission depending on your condition and test results.  What to do if you are LOW RISK for COVID-19?  Reduce your risk of any infection by using the same precautions used for avoiding the common cold or flu:  . Wash your hands often with soap and warm water for at least 20 seconds.  If soap and water are not readily available, use an alcohol-based hand sanitizer with at least 60% alcohol.  . If coughing or sneezing, cover your mouth and nose by coughing or sneezing into the elbow areas of your shirt or coat, into a tissue or into your sleeve (not your hands). . Avoid shaking hands with others and consider head nods or verbal greetings only. . Avoid touching your eyes, nose, or mouth with unwashed hands.  . Avoid close contact with people who are sick. . Avoid places or events with large numbers of people in one location, like concerts or sporting events. . Carefully consider travel plans you have or are making. . If you are planning any travel outside or inside the [place], visit the CDC's Travelers' Health webpage for the latest health notices. . If you have some symptoms but not all symptoms, continue to monitor at home and seek medical attention if your symptoms worsen. . If you are having a medical emergency, call 911.   ADDITIONAL HEALTHCARE OPTIONS FOR PATIENTS  [facility] / e-Visit: [email or web address]         [facility]: [phone]  [place]   [facility]: [phone]                   [facility]: [phone]   Prescreened- neg. cm  

## 2019-02-26 ENCOUNTER — Ambulatory Visit (INDEPENDENT_AMBULATORY_CARE_PROVIDER_SITE_OTHER): Payer: Medicare Other

## 2019-02-26 ENCOUNTER — Other Ambulatory Visit: Payer: Self-pay

## 2019-02-26 ENCOUNTER — Other Ambulatory Visit: Payer: Medicare Other

## 2019-02-26 ENCOUNTER — Encounter: Payer: Medicare Other | Admitting: Certified Nurse Midwife

## 2019-02-26 DIAGNOSIS — Z131 Encounter for screening for diabetes mellitus: Secondary | ICD-10-CM

## 2019-02-26 DIAGNOSIS — O350XX0 Maternal care for (suspected) central nervous system malformation in fetus, not applicable or unspecified: Secondary | ICD-10-CM

## 2019-02-26 DIAGNOSIS — O3503X0 Maternal care for (suspected) central nervous system malformation or damage in fetus, choroid plexus cysts, not applicable or unspecified: Secondary | ICD-10-CM

## 2019-02-26 DIAGNOSIS — Z362 Encounter for other antenatal screening follow-up: Secondary | ICD-10-CM | POA: Diagnosis not present

## 2019-02-26 DIAGNOSIS — Z3493 Encounter for supervision of normal pregnancy, unspecified, third trimester: Secondary | ICD-10-CM

## 2019-02-26 DIAGNOSIS — Z3492 Encounter for supervision of normal pregnancy, unspecified, second trimester: Secondary | ICD-10-CM

## 2019-02-26 DIAGNOSIS — Z3A28 28 weeks gestation of pregnancy: Secondary | ICD-10-CM | POA: Diagnosis not present

## 2019-02-26 DIAGNOSIS — Z130 Encounter for screening for diseases of the blood and blood-forming organs and certain disorders involving the immune mechanism: Secondary | ICD-10-CM

## 2019-02-27 LAB — RPR: RPR Ser Ql: NONREACTIVE

## 2019-02-27 LAB — GLUCOSE, 1 HOUR GESTATIONAL: Gestational Diabetes Screen: 68 mg/dL (ref 65–139)

## 2019-03-19 ENCOUNTER — Encounter: Payer: Medicare Other | Admitting: Certified Nurse Midwife

## 2019-03-21 ENCOUNTER — Telehealth: Payer: Self-pay | Admitting: *Deleted

## 2019-03-21 NOTE — Telephone Encounter (Signed)
Coronavirus (COVID-19) Are you at risk?  Are you at risk for the Coronavirus (COVID-19)?  To be considered HIGH RISK for Coronavirus (COVID-19), you have to meet the following criteria:  . Traveled to [place], [place], [place], [place] or [place]; or in the [place] to [place], [place], [place], or [place]; and have fever, cough, and shortness of breath within the last 2 weeks of travel OR . Been in close contact with a person diagnosed with COVID-19 within the last 2 weeks and have fever, cough, and shortness of breath . IF YOU DO NOT MEET THESE CRITERIA, YOU ARE CONSIDERED LOW RISK FOR COVID-19.  What to do if you are HIGH RISK for COVID-19?  . If you are having a medical emergency, call 911. . Seek medical care right away. Before you go to a doctor's office, urgent care or emergency department, call ahead and tell them about your recent travel, contact with someone diagnosed with COVID-19, and your symptoms. You should receive instructions from your physician's office regarding next steps of care.  . When you arrive at healthcare provider, tell the healthcare staff immediately you have returned from visiting [place], [place], [place], [place] or [place]; or traveled in the [place] to [place], [place], [place], or [place]; in the last two weeks or you have been in close contact with a person diagnosed with COVID-19 in the last 2 weeks.   . Tell the health care staff about your symptoms: fever, cough and shortness of breath. . After you have been seen by a medical provider, you will be either: o Tested for (COVID-19) and discharged home on quarantine except to seek medical care if symptoms worsen, and asked to  - Stay home and avoid contact with others until you get your results (4-5 days)  - Avoid travel on public transportation if possible (such as bus, train, or airplane) or o Sent to the Emergency Department by EMS for evaluation, COVID-19 testing, and possible  admission depending on your condition and test results.  What to do if you are LOW RISK for COVID-19?  Reduce your risk of any infection by using the same precautions used for avoiding the common cold or flu:  . Wash your hands often with soap and warm water for at least 20 seconds.  If soap and water are not readily available, use an alcohol-based hand sanitizer with at least 60% alcohol.  . If coughing or sneezing, cover your mouth and nose by coughing or sneezing into the elbow areas of your shirt or coat, into a tissue or into your sleeve (not your hands). . Avoid shaking hands with others and consider head nods or verbal greetings only. . Avoid touching your eyes, nose, or mouth with unwashed hands.  . Avoid close contact with people who are sick. . Avoid places or events with large numbers of people in one location, like concerts or sporting events. . Carefully consider travel plans you have or are making. . If you are planning any travel outside or inside the [place], visit the CDC's Travelers' Health webpage for the latest health notices. . If you have some symptoms but not all symptoms, continue to monitor at home and seek medical attention if your symptoms worsen. . If you are having a medical emergency, call 911.   ADDITIONAL HEALTHCARE OPTIONS FOR PATIENTS  [facility] / e-Visit: [email or web address]         [facility]: [phone]  [place]   [facility]: [phone]                   [facility]: [phone]   Spoke with pt denies any sx.  Jovonta Levit, CMA 

## 2019-03-22 ENCOUNTER — Other Ambulatory Visit: Payer: Self-pay

## 2019-03-22 ENCOUNTER — Ambulatory Visit (INDEPENDENT_AMBULATORY_CARE_PROVIDER_SITE_OTHER): Payer: Medicare Other | Admitting: Certified Nurse Midwife

## 2019-03-22 VITALS — BP 105/49 | HR 81 | Wt 151.5 lb

## 2019-03-22 DIAGNOSIS — Z3493 Encounter for supervision of normal pregnancy, unspecified, third trimester: Secondary | ICD-10-CM

## 2019-03-22 LAB — POCT URINALYSIS DIPSTICK OB
Bilirubin, UA: NEGATIVE
Blood, UA: NEGATIVE
Glucose, UA: NEGATIVE
Ketones, UA: NEGATIVE
Leukocytes, UA: NEGATIVE
Nitrite, UA: NEGATIVE
Spec Grav, UA: >=1.03 — AB (ref 1.010–1.025)
Urobilinogen, UA: 0.2 E.U./dL
pH, UA: 5 (ref 5.0–8.0)

## 2019-03-22 NOTE — Patient Instructions (Signed)
Fetal Movement Counts Patient Name: ________________________________________________ Patient Due Date: ____________________ What is a fetal movement count?  A fetal movement count is the number of times that you feel your baby move during a certain amount of time. This may also be called a fetal kick count. A fetal movement count is recommended for every pregnant woman. You may be asked to start counting fetal movements as early as week 28 of your pregnancy. Pay attention to when your baby is most active. You may notice your baby's sleep and wake cycles. You may also notice things that make your baby move more. You should do a fetal movement count:  When your baby is normally most active.  At the same time each day. A good time to count movements is while you are resting, after having something to eat and drink. How do I count fetal movements? 1. Find a quiet, comfortable area. Sit, or lie down on your side. 2. Write down the date, the start time and stop time, and the number of movements that you felt between those two times. Take this information with you to your health care visits. 3. For 2 hours, count kicks, flutters, swishes, rolls, and jabs. You should feel at least 10 movements during 2 hours. 4. You may stop counting after you have felt 10 movements. 5. If you do not feel 10 movements in 2 hours, have something to eat and drink. Then, keep resting and counting for 1 hour. If you feel at least 4 movements during that hour, you may stop counting. Contact a health care provider if:  You feel fewer than 4 movements in 2 hours.  Your baby is not moving like he or she usually does. Date: ____________ Start time: ____________ Stop time: ____________ Movements: ____________ Date: ____________ Start time: ____________ Stop time: ____________ Movements: ____________ Date: ____________ Start time: ____________ Stop time: ____________ Movements: ____________ Date: ____________ Start time:  ____________ Stop time: ____________ Movements: ____________ Date: ____________ Start time: ____________ Stop time: ____________ Movements: ____________ Date: ____________ Start time: ____________ Stop time: ____________ Movements: ____________ Date: ____________ Start time: ____________ Stop time: ____________ Movements: ____________ Date: ____________ Start time: ____________ Stop time: ____________ Movements: ____________ Date: ____________ Start time: ____________ Stop time: ____________ Movements: ____________ This information is not intended to replace advice given to you by your health care provider. Make sure you discuss any questions you have with your health care provider. Document Released: 12/14/2006 Document Revised: 07/13/2016 Document Reviewed: 12/24/2015 Elsevier Interactive Patient Education  2019 [employer]. Third Trimester of Pregnancy  The third trimester is from week 28 through week 40 (months 7 through 9). This trimester is when your unborn baby (fetus) is growing very fast. At the end of the ninth month, the unborn baby is about 20 inches in length. It weighs about 6-10 pounds. Follow these instructions at home: Medicines  Take over-the-counter and prescription medicines only as told by your doctor. Some medicines are safe and some medicines are not safe during pregnancy.  Take a prenatal vitamin that contains at least 600 micrograms (mcg) of folic acid.  If you have trouble pooping (constipation), take medicine that will make your stool soft (stool softener) if your doctor approves. Eating and drinking   Eat regular, healthy meals.  Avoid raw meat and uncooked cheese.  If you get low calcium from the food you eat, talk to your doctor about taking a daily calcium supplement.  Eat four or five small meals rather than three large meals a day.  Avoid foods that are high in fat and sugars, such as fried and sweet foods.  To prevent constipation: ? Eat foods  that are high in fiber, like fresh fruits and vegetables, whole grains, and beans. ? Drink enough fluids to keep your pee (urine) clear or pale yellow. Activity  Exercise only as told by your doctor. Stop exercising if you start to have cramps.  Avoid heavy lifting, wear low heels, and sit up straight.  Do not exercise if it is too hot, too humid, or if you are in a place of great height (high altitude).  You may continue to have sex unless your doctor tells you not to. Relieving pain and discomfort  Wear a good support bra if your breasts are tender.  Take frequent breaks and rest with your legs raised if you have leg cramps or low back pain.  Take warm water baths (sitz baths) to soothe pain or discomfort caused by hemorrhoids. Use hemorrhoid cream if your doctor approves.  If you develop puffy, bulging veins (varicose veins) in your legs: ? Wear support hose or compression stockings as told by your doctor. ? Raise (elevate) your feet for 15 minutes, 3-4 times a day. ? Limit salt in your food. Safety  Wear your seat belt when driving.  Make a list of emergency phone numbers, including numbers for family, friends, the hospital, and police and fire departments. Preparing for your baby's arrival To prepare for the arrival of your baby:  Take prenatal classes.  Practice driving to the hospital.  Visit the hospital and tour the maternity area.  Talk to your work about taking leave once the baby comes.  Pack your hospital bag.  Prepare the baby's room.  Go to your doctor visits.  Buy a rear-facing car seat. Learn how to install it in your car. General instructions  Do not use hot tubs, steam rooms, or saunas.  Do not use any products that contain nicotine or tobacco, such as cigarettes and e-cigarettes. If you need help quitting, ask your doctor.  Do not drink alcohol.  Do not douche or use tampons or scented sanitary pads.  Do not cross your legs for long periods  of time.  Do not travel for long distances unless you must. Only do so if your doctor says it is okay.  Visit your dentist if you have not gone during your pregnancy. Use a soft toothbrush to brush your teeth. Be gentle when you floss.  Avoid cat litter boxes and soil used by cats. These carry germs that can cause birth defects in the baby and can cause a loss of your baby (miscarriage) or stillbirth.  Keep all your prenatal visits as told by your doctor. This is important. Contact a doctor if:  You are not sure if you are in labor or if your water has broken.  You are dizzy.  You have mild cramps or pressure in your lower belly.  You have a nagging pain in your belly area.  You continue to feel sick to your stomach, you throw up, or you have watery poop.  You have bad smelling fluid coming from your vagina.  You have pain when you pee. Get help right away if:  You have a fever.  You are leaking fluid from your vagina.  You are spotting or bleeding from your vagina.  You have severe belly cramps or pain.  You lose or gain weight quickly.  You have trouble catching your breath and have  chest pain.  You notice sudden or extreme puffiness (swelling) of your face, hands, ankles, feet, or legs.  You have not felt the baby move in over an hour.  You have severe headaches that do not go away with medicine.  You have trouble seeing.  You are leaking, or you are having a gush of fluid, from your vagina before you are 37 weeks.  You have regular belly spasms (contractions) before you are 37 weeks. Summary  The third trimester is from week 28 through week 40 (months 7 through 9). This time is when your unborn baby is growing very fast.  Follow your doctor's advice about medicine, food, and activity.  Get ready for the arrival of your baby by taking prenatal classes, getting all the baby items ready, preparing the baby's room, and visiting your doctor to be checked.  Get  help right away if you are bleeding from your vagina, or you have chest pain and trouble catching your breath, or if you have not felt your baby move in over an hour. This information is not intended to replace advice given to you by your health care provider. Make sure you discuss any questions you have with your health care provider. Document Released: 02/08/2010 Document Revised: 12/20/2016 Document Reviewed: 12/20/2016 Elsevier Interactive Patient Education  2019 [employer]  

## 2019-03-22 NOTE — Progress Notes (Signed)
ROB-Doing well, notes increased pelvic pressure. Educatoin regarding home treatment measures. Desires TOLAC. Discussed risks vs benefits of TOLAC vs repeat C-section.  Risk of uterine rupture at term is ~ 1%.  Risk of failed trial of labor after cesarean (TOLAC) without a vaginal birth after cesarean (VBAC) resulting in repeat cesarean delivery (RCD) in about 20 to 40 percent of women who attempt VBAC.  Risk of requiring emergency C-section due to uterine rupture discussed. The benefits of a trial of labor after cesarean (TOLAC) resulting in a vaginal birth after cesarean (VBAC) include the following: shorter length of hospital stay and postpartum recovery (in most cases); fewer complications, such as postpartum fever, wound or uterine infection, thromboembolism (blood clots in the leg or lung), need for blood transfusion and fewer neonatal breathing problems.  Patient notes understanding.  Still desires TOLAC.  Patient will see MD next visit for official TOLAC consult. Anticipatory guidance regarding course of prenatal care. Discussed COVID restrictions and precautions. Reviewed red flag symptoms and when to call. RTC x 3 weeks for [facility] consult with MD. RTC x 5 weeks for 36 week cultures and ROB or sooner if needed.

## 2019-04-02 ENCOUNTER — Telehealth: Payer: Self-pay | Admitting: *Deleted

## 2019-04-02 NOTE — Telephone Encounter (Signed)
Coronavirus (COVID-19) Are you at risk?  Are you at risk for the Coronavirus (COVID-19)?  To be considered HIGH RISK for Coronavirus (COVID-19), you have to meet the following criteria:  . Traveled to [place], [place], [place], [place] or [place]; or in the [place] to [place], [place], [place], or [place]; and have fever, cough, and shortness of breath within the last 2 weeks of travel OR . Been in close contact with a person diagnosed with COVID-19 within the last 2 weeks and have fever, cough, and shortness of breath . IF YOU DO NOT MEET THESE CRITERIA, YOU ARE CONSIDERED LOW RISK FOR COVID-19.  What to do if you are HIGH RISK for COVID-19?  . If you are having a medical emergency, call 911. . Seek medical care right away. Before you go to a doctor's office, urgent care or emergency department, call ahead and tell them about your recent travel, contact with someone diagnosed with COVID-19, and your symptoms. You should receive instructions from your physician's office regarding next steps of care.  . When you arrive at healthcare provider, tell the healthcare staff immediately you have returned from visiting [place], [place], [place], [place] or [place]; or traveled in the [place] to [place], [place], [place], or [place]; in the last two weeks or you have been in close contact with a person diagnosed with COVID-19 in the last 2 weeks.   . Tell the health care staff about your symptoms: fever, cough and shortness of breath. . After you have been seen by a medical provider, you will be either: o Tested for (COVID-19) and discharged home on quarantine except to seek medical care if symptoms worsen, and asked to  - Stay home and avoid contact with others until you get your results (4-5 days)  - Avoid travel on public transportation if possible (such as bus, train, or airplane) or o Sent to the Emergency Department by EMS for evaluation, COVID-19 testing, and possible  admission depending on your condition and test results.  What to do if you are LOW RISK for COVID-19?  Reduce your risk of any infection by using the same precautions used for avoiding the common cold or flu:  . Wash your hands often with soap and warm water for at least 20 seconds.  If soap and water are not readily available, use an alcohol-based hand sanitizer with at least 60% alcohol.  . If coughing or sneezing, cover your mouth and nose by coughing or sneezing into the elbow areas of your shirt or coat, into a tissue or into your sleeve (not your hands). . Avoid shaking hands with others and consider head nods or verbal greetings only. . Avoid touching your eyes, nose, or mouth with unwashed hands.  . Avoid close contact with people who are sick. . Avoid places or events with large numbers of people in one location, like concerts or sporting events. . Carefully consider travel plans you have or are making. . If you are planning any travel outside or inside the [place], visit the CDC's Travelers' Health webpage for the latest health notices. . If you have some symptoms but not all symptoms, continue to monitor at home and seek medical attention if your symptoms worsen. . If you are having a medical emergency, call 911.   ADDITIONAL HEALTHCARE OPTIONS FOR PATIENTS  [facility] / e-Visit: [email or web address]         [facility]: [phone]  [facility]: [phone]                   [facility]: [phone]   Spoke with pt, denies any sx.  Keyshon Stein, CMA

## 2019-04-03 ENCOUNTER — Ambulatory Visit (INDEPENDENT_AMBULATORY_CARE_PROVIDER_SITE_OTHER): Payer: Medicare Other | Admitting: Certified Nurse Midwife

## 2019-04-03 ENCOUNTER — Encounter: Payer: Self-pay | Admitting: Certified Nurse Midwife

## 2019-04-03 ENCOUNTER — Other Ambulatory Visit: Payer: Self-pay

## 2019-04-03 ENCOUNTER — Other Ambulatory Visit (HOSPITAL_COMMUNITY)
Admission: RE | Admit: 2019-04-03 | Discharge: 2019-04-03 | Disposition: A | Discharge status: Home / Self Care | Payer: Medicare Other | Source: Ambulatory Visit | Attending: Certified Nurse Midwife | Admitting: Certified Nurse Midwife

## 2019-04-03 VITALS — BP 98/49 | HR 66 | Wt 156.5 lb

## 2019-04-03 DIAGNOSIS — N898 Other specified noninflammatory disorders of vagina: Secondary | ICD-10-CM | POA: Insufficient documentation

## 2019-04-03 DIAGNOSIS — R102 Pelvic and perineal pain: Secondary | ICD-10-CM

## 2019-04-03 DIAGNOSIS — O26893 Other specified pregnancy related conditions, third trimester: Secondary | ICD-10-CM

## 2019-04-03 LAB — POCT URINALYSIS DIPSTICK OB
Bilirubin, UA: NEGATIVE
Blood, UA: NEGATIVE
Glucose, UA: NEGATIVE
Ketones, UA: NEGATIVE
Leukocytes, UA: NEGATIVE
Nitrite, UA: NEGATIVE
POC,PROTEIN,UA: NEGATIVE
Spec Grav, UA: 1.025 (ref 1.010–1.025)
Urobilinogen, UA: 0.2 E.U./dL
pH, UA: 6 (ref 5.0–8.0)

## 2019-04-03 NOTE — Addendum Note (Signed)
Addended by: Brooke Dare on: 04/03/2019 03:19 PM   Modules accepted: Orders

## 2019-04-03 NOTE — Progress Notes (Signed)
Pt presents for problem visit. She complains of increased vaginal discharge with light yellow color . She denies odor and itching. She admits to painful intercourse and pelvic pressure that feels like something is coming out approximately 2 time an hour. . Sterile speculum exam- cervix appears closed. FFN and swab collected for BV/yeast/GC/Chlamydia/tric. She denies any new partners. Will follow up with results. She is to return as scheduled for MD consult (TOLAC). She verbalizes and agrees to plan.   Doreene Burke, CNM

## 2019-04-03 NOTE — Patient Instructions (Signed)
Vaginitis  Vaginitis is a condition in which the vaginal tissue swells and becomes red (inflamed). This condition is most often caused by a change in the normal balance of bacteria and yeast that live in the vagina. This change causes an overgrowth of certain bacteria or yeast, which causes the inflammation. There are different types of vaginitis, but the most common types are:   Bacterial vaginosis.   Yeast infection (candidiasis).   Trichomoniasis vaginitis. This is a sexually transmitted disease (STD).   Viral vaginitis.   Atrophic vaginitis.   Allergic vaginitis.  What are the causes?  The cause of this condition depends on the type of vaginitis. It can be caused by:   Bacteria (bacterial vaginosis).   Yeast, which is a fungus (yeast infection).   A parasite (trichomoniasis vaginitis).   A virus (viral vaginitis).   Low hormone levels (atrophic vaginitis). Low hormone levels can occur during pregnancy, breastfeeding, or after menopause.   Irritants, such as bubble baths, scented tampons, and feminine sprays (allergic vaginitis).  Other factors can change the normal balance of the yeast and bacteria that live in the vagina. These include:   Antibiotic medicines.   Poor hygiene.   Diaphragms, vaginal sponges, spermicides, birth control pills, and intrauterine devices (IUD).   Sex.   Infection.   Uncontrolled diabetes.   A weakened defense (immune) system.  What increases the risk?  This condition is more likely to develop in women who:   Smoke.   Use vaginal douches, scented tampons, or scented sanitary pads.   Wear tight-fitting pants.   Wear thong underwear.   Use oral birth control pills or an IUD.   Have sex without a condom.   Have multiple sex partners.   Have an STD.   Frequently use the spermicide nonoxynol-9.   Eat lots of foods high in sugar.   Have uncontrolled diabetes.   Have low estrogen levels.   Have a weakened immune system from an immune disorder or medical  treatment.   Are pregnant or breastfeeding.  What are the signs or symptoms?  Symptoms vary depending on the cause of the vaginitis. Common symptoms include:   Abnormal vaginal discharge.  ? The discharge is white, gray, or yellow with bacterial vaginosis.  ? The discharge is thick, white, and cheesy with a yeast infection.  ? The discharge is frothy and yellow or greenish with trichomoniasis.   A bad vaginal smell. The smell is fishy with bacterial vaginosis.   Vaginal itching, pain, or swelling.   Sex that is painful.   Pain or burning when urinating.  Sometimes there are no symptoms.  How is this diagnosed?  This condition is diagnosed based on your symptoms and medical history. A physical exam, including a pelvic exam, will also be done. You may also have other tests, including:   Tests to determine the pH level (acidity or alkalinity) of your vagina.   A whiff test, to assess the odor that results when a sample of your vaginal discharge is mixed with a potassium hydroxide solution.   Tests of vaginal fluid. A sample will be examined under a microscope.  How is this treated?  Treatment varies depending on the type of vaginitis you have. Your treatment may include:   Antibiotic creams or pills to treat bacterial vaginosis and trichomoniasis.   Antifungal medicines, such as vaginal creams or suppositories, to treat a yeast infection.   Medicine to ease discomfort if you have viral vaginitis. Your sexual partner   should also be treated.   Estrogen delivered in a cream, pill, suppository, or vaginal ring to treat atrophic vaginitis. If vaginal dryness occurs, lubricants and moisturizing creams may help. You may need to avoid scented soaps, sprays, or douches.   Stopping use of a product that is causing allergic vaginitis. Then using a vaginal cream to treat the symptoms.  Follow these instructions at home:  Lifestyle   Keep your genital area clean and dry. Avoid soap, and only rinse the area with  water.   Do not douche or use tampons until your health care provider says it is okay to do so. Use sanitary pads, if needed.   Do not have sex until your health care provider approves. When you can return to sex, practice safe sex and use condoms.   Wipe from front to back. This avoids the spread of bacteria from the rectum to the vagina.  General instructions   Take over-the-counter and prescription medicines only as told by your health care provider.   If you were prescribed an antibiotic medicine, take or use it as told by your health care provider. Do not stop taking or using the antibiotic even if you start to feel better.   Keep all follow-up visits as told by your health care provider. This is important.  How is this prevented?   Use mild, non-scented products. Do not use things that can irritate the vagina, such as fabric softeners. Avoid the following products if they are scented:  ? Feminine sprays.  ? Detergents.  ? Tampons.  ? Feminine hygiene products.  ? Soaps or bubble baths.   Let air reach your genital area.  ? Wear cotton underwear to reduce moisture buildup.  ? Avoid wearing underwear while you sleep.  ? Avoid wearing tight pants and underwear or nylons without a cotton panel.  ? Avoid wearing thong underwear.   Take off any wet clothing, such as bathing suits, as soon as possible.   Practice safe sex and use condoms.  Contact a health care provider if:   You have abdominal pain.   You have a fever.   You have symptoms that last for more than 2-3 days.  Get help right away if:   You have a fever and your symptoms suddenly get worse.  Summary   Vaginitis is a condition in which the vaginal tissue becomes inflamed.This condition is most often caused by a change in the normal balance of bacteria and yeast that live in the vagina.   Treatment varies depending on the type of vaginitis you have.   Do not douche, use tampons , or have sex until your health care provider approves. When  you can return to sex, practice safe sex and use condoms.  This information is not intended to replace advice given to you by your health care provider. Make sure you discuss any questions you have with your health care provider.  Document Released: 09/11/2007 Document Revised: 12/20/2016 Document Reviewed: 12/20/2016  Elsevier Interactive Patient Education  2019 Elsevier Inc.

## 2019-04-05 LAB — CERVICOVAGINAL ANCILLARY ONLY
Bacterial vaginitis: NEGATIVE
Candida vaginitis: NEGATIVE
Chlamydia: NEGATIVE
Neisseria Gonorrhea: NEGATIVE
Trichomonas: NEGATIVE

## 2019-04-10 ENCOUNTER — Telehealth: Payer: Self-pay

## 2019-04-10 NOTE — Telephone Encounter (Signed)
Called pt to see if she would change her appointment on Friday to the P.M. per [facility]. No answer and no voicemail set up.

## 2019-04-12 ENCOUNTER — Encounter: Payer: Medicare Other | Admitting: Obstetrics and Gynecology

## 2019-04-16 ENCOUNTER — Encounter: Payer: Self-pay | Admitting: Obstetrics and Gynecology

## 2019-04-16 ENCOUNTER — Other Ambulatory Visit: Payer: Self-pay

## 2019-04-16 ENCOUNTER — Ambulatory Visit (INDEPENDENT_AMBULATORY_CARE_PROVIDER_SITE_OTHER): Payer: Medicare Other | Admitting: Obstetrics and Gynecology

## 2019-04-16 VITALS — BP 96/59 | HR 75 | Wt 160.6 lb

## 2019-04-16 DIAGNOSIS — Z3483 Encounter for supervision of other normal pregnancy, third trimester: Secondary | ICD-10-CM

## 2019-04-16 DIAGNOSIS — O34219 Maternal care for unspecified type scar from previous cesarean delivery: Secondary | ICD-10-CM

## 2019-04-16 DIAGNOSIS — O26843 Uterine size-date discrepancy, third trimester: Secondary | ICD-10-CM

## 2019-04-16 LAB — POCT URINALYSIS DIPSTICK OB
Bilirubin, UA: NEGATIVE
Blood, UA: NEGATIVE
Glucose, UA: NEGATIVE
Ketones, UA: NEGATIVE
Leukocytes, UA: NEGATIVE
Nitrite, UA: NEGATIVE
POC,PROTEIN,UA: NEGATIVE
Spec Grav, UA: 1.01 (ref 1.010–1.025)
Urobilinogen, UA: 1 E.U./dL
pH, UA: 7 (ref 5.0–8.0)
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
## 2019-04-16 NOTE — Patient Instructions (Signed)
Preparing for Vaginal Birth After Cesarean Delivery  Vaginal birth after cesarean delivery (VBAC) is giving birth vaginally after previously delivering a baby through a cesarean section (C-section). You and your health are provider will discuss your options and whether you may be a good candidate for VBAC.  What are my options?  After a cesarean delivery, your options for future deliveries may include:   Scheduled repeat cesarean delivery. This is done in a hospital with an operating room.   Trial of labor after cesarean (TOLAC). A successful TOLAC results in a vaginal delivery. If it is not successful, you will need to have a cesarean delivery. TOLAC should be attempted in facilities where an emergency cesarean delivery can be performed. It should not be done as a home birth.  Talk with your health care provider about the risks and benefits of each option early in your pregnancy. The best option for you will depend on your preferences and your overall health as well as your baby's.  What should I know about my past cesarean delivery?  It is important to know what type of incision was made in your uterus in a past cesarean delivery. The type of incision can affect the success of your TOLAC. Types of incisions include:   Low transverse. This is a side-to-side cut low on your uterus. The scar on your skin looks like a horizontal line just above your pubic area. This type of cut is the most common and makes you a good candidate for TOLAC.   Low vertical. This is an up-and-down cut low on your uterus. The scar on your skin looks like a vertical line between your pubic area and belly button. This type of cut puts you at higher risk for problems during TOLAC.   High vertical or classical. This is an up-and-down cut high on your uterus. The scar on your skin looks like a vertical line that runs over the top of your belly button. This type of cut has the highest risk for problems and usually means that TOLAC is not an  option.  When is VBAC not an option?  As you progress through your pregnancy, circumstances may change and you may need to reconsider your options. Your situation may also change even as you begin TOLAC. Your health care provider may not want you to attempt a VBAC if you:   Need to have labor started (induced) because your cervix is not ready for labor.   Have never had a vaginal delivery.   Have had more than two cesarean deliveries.   Are overdue.   Are pregnant with a very large baby.   Have a condition that causes high blood pressure (preeclampsia).  Questions to ask your health care provider   Am I a good candidate for TOLAC?   What are my chances of a successful vaginal delivery?   Is my preferred birth location equipped for a TOLAC?   What are my pain management options during a TOLAC?  Where to find more information   American Congress of Obstetricians and Gynecologists: www.acog.org   American College of Nurse-Midwives: www.midwife.org  Summary   Vaginal birth after cesarean delivery (VBAC) is giving birth vaginally after previously delivering a baby through a cesarean section (C-section).   VBAC may be a safe and appropriate option for you depending on your medical history and other risk factors. Talk with your health care provider about the options available to you, and the risks and benefits of each early   in your pregnancy.   TOLAC should be attempted in facilities where emergency cesarean section procedures can be performed.  This information is not intended to replace advice given to you by your health care provider. Make sure you discuss any questions you have with your health care provider.  Document Released: 08/02/2011 Document Revised: 02/23/2017 Document Reviewed: 02/23/2017  Elsevier Interactive Patient Education  2019 Elsevier Inc.

## 2019-04-19 ENCOUNTER — Telehealth: Payer: Self-pay

## 2019-04-19 NOTE — Telephone Encounter (Signed)
Coronavirus (COVID-19) Are you at risk?  Are you at risk for the Coronavirus (COVID-19)?  To be considered HIGH RISK for Coronavirus (COVID-19), you have to meet the following criteria:  . Traveled to [place], [place], [place], [place] or [place]; or in the [place] to [place], [place], [place], or [place]; and have fever, cough, and shortness of breath within the last 2 weeks of travel OR . Been in close contact with a person diagnosed with COVID-19 within the last 2 weeks and have fever, cough, and shortness of breath . IF YOU DO NOT MEET THESE CRITERIA, YOU ARE CONSIDERED LOW RISK FOR COVID-19.  What to do if you are HIGH RISK for COVID-19?  . If you are having a medical emergency, call 911. . Seek medical care right away. Before you go to a doctor's office, urgent care or emergency department, call ahead and tell them about your recent travel, contact with someone diagnosed with COVID-19, and your symptoms. You should receive instructions from your physician's office regarding next steps of care.  . When you arrive at healthcare provider, tell the healthcare staff immediately you have returned from visiting [place], [place], [place], [place] or [place]; or traveled in the [place] to [place], [place], [place], or [place]; in the last two weeks or you have been in close contact with a person diagnosed with COVID-19 in the last 2 weeks.   . Tell the health care staff about your symptoms: fever, cough and shortness of breath. . After you have been seen by a medical provider, you will be either: o Tested for (COVID-19) and discharged home on quarantine except to seek medical care if symptoms worsen, and asked to  - Stay home and avoid contact with others until you get your results (4-5 days)  - Avoid travel on public transportation if possible (such as bus, train, or airplane) or o Sent to the Emergency Department by EMS for evaluation, COVID-19 testing, and possible  admission depending on your condition and test results.  What to do if you are LOW RISK for COVID-19?  Reduce your risk of any infection by using the same precautions used for avoiding the common cold or flu:  . Wash your hands often with soap and warm water for at least 20 seconds.  If soap and water are not readily available, use an alcohol-based hand sanitizer with at least 60% alcohol.  . If coughing or sneezing, cover your mouth and nose by coughing or sneezing into the elbow areas of your shirt or coat, into a tissue or into your sleeve (not your hands). . Avoid shaking hands with others and consider head nods or verbal greetings only. . Avoid touching your eyes, nose, or mouth with unwashed hands.  . Avoid close contact with people who are sick. . Avoid places or events with large numbers of people in one location, like concerts or sporting events. . Carefully consider travel plans you have or are making. . If you are planning any travel outside or inside the [place], visit the CDC's Travelers' Health webpage for the latest health notices. . If you have some symptoms but not all symptoms, continue to monitor at home and seek medical attention if your symptoms worsen. . If you are having a medical emergency, call 911.   ADDITIONAL HEALTHCARE OPTIONS FOR PATIENTS  [facility] / e-Visit: [email or web address]         [facility]: [phone]  [facility]: [phone]                   [facility]: [phone]   Prescreened. Neg .cm 

## 2019-04-22 ENCOUNTER — Other Ambulatory Visit: Payer: Self-pay

## 2019-04-22 ENCOUNTER — Observation Stay
Admission: EM | Admit: 2019-04-22 | Discharge: 2019-04-22 | Disposition: A | Discharge status: Home / Self Care | Payer: Medicare Other | Attending: Certified Nurse Midwife | Admitting: Certified Nurse Midwife

## 2019-04-22 DIAGNOSIS — Z3A35 35 weeks gestation of pregnancy: Secondary | ICD-10-CM | POA: Insufficient documentation

## 2019-04-22 DIAGNOSIS — O26893 Other specified pregnancy related conditions, third trimester: Principal | ICD-10-CM | POA: Insufficient documentation

## 2019-04-22 DIAGNOSIS — M545 Low back pain: Secondary | ICD-10-CM | POA: Diagnosis not present

## 2019-04-22 DIAGNOSIS — R102 Pelvic and perineal pain: Secondary | ICD-10-CM | POA: Diagnosis not present

## 2019-04-22 DIAGNOSIS — M549 Dorsalgia, unspecified: Secondary | ICD-10-CM

## 2019-04-22 DIAGNOSIS — Z79899 Other long term (current) drug therapy: Secondary | ICD-10-CM | POA: Diagnosis not present

## 2019-04-22 LAB — URINALYSIS, ROUTINE W REFLEX MICROSCOPIC
Bilirubin Urine: NEGATIVE
Glucose, UA: 50 mg/dL — AB
Hgb urine dipstick: NEGATIVE
Ketones, ur: NEGATIVE mg/dL
Leukocytes,Ua: NEGATIVE
Nitrite: NEGATIVE
Protein, ur: NEGATIVE mg/dL
Specific Gravity, Urine: 1.012 (ref 1.005–1.030)
pH: 6 (ref 5.0–8.0)

## 2019-04-22 MED ORDER — ACETAMINOPHEN 500 MG PO TABS
1000.0000 mg | ORAL_TABLET | Freq: Once | ORAL | Status: AC
  Administered 2019-04-22: 21:00:00 1000 mg via ORAL

## 2019-04-22 MED ORDER — ACETAMINOPHEN 500 MG PO TABS
ORAL_TABLET | ORAL | Status: AC
  Filled 2019-04-22: qty 2

## 2019-04-22 NOTE — OB Triage Note (Signed)
Pt arrival to triage with c/o lower abdominal and back pain/pressure.  Pt states increased urgency to void and increased "fishy-smelling" mucous-like discharge.  Pt denies vaginal bleeding and LOF and is feeling baby move. EFM and toco applied and assessing.

## 2019-04-22 NOTE — OB Triage Note (Signed)
   L&D OB Triage Note  SUBJECTIVE Renee [name] is a 28 y.o. G52P2002 female at [redacted]w[redacted]d, EDD Estimated Date of Delivery: 05/21/19 who presented to triage with complaints of back pain and pelvic discomfort. She denies contractions, LOF, vaginal bleeding, and feels good movement. She also complains of vaginal discharge.    OB History  Gravida Para Term Preterm AB Living  3 2 2  0 0 2  SAB TAB Ectopic Multiple Live Births  0 0 0 0 2    # Outcome Date GA Lbr Len/2nd Weight Sex Delivery Anes PTL Lv  3 Current           2 Term 2012    F CS-LTranv   LIV  1 Term 2011    M CS-LTranv   LIV    Medications Prior to Admission  Medication Sig Dispense Refill Last Dose  . buprenorphine (SUBUTEX) 8 MG SUBL SL tablet PLACE 1 TABLET UNDER THE TONGUE TWICE DAILY   04/22/2019 at Unknown time  . Iron, Ferrous Sulfate, 142 (45 Fe) MG TBCR Take 1 tablet by mouth 2 (two) times daily. 30 tablet 0 04/22/2019 at Unknown time  . ondansetron (ZOFRAN-ODT) 4 MG disintegrating tablet Take 1 tablet (4 mg total) by mouth every 8 (eight) hours as needed for nausea or vomiting. 30 tablet 2 04/22/2019 at Unknown time  . Prenatal-DSS-FeCb-FeGl-FA (CITRANATAL BLOOM) 90-1 MG TABS Take 1 tablet by mouth daily. 30 tablet 11 04/22/2019 at Unknown time     OBJECTIVE  Nursing Evaluation:   BP (!) 118/54 (BP Location: Left Arm)   Pulse 76   Temp 98.5 F (36.9 C) (Oral)   Resp 18   Ht 5\' 3"  (1.6 m)   Wt 72.6 kg   LMP 08/16/2018 (Approximate)   BMI 28.34 kg/m    Findings:  Musculoskeletal discomforts in pregnancy.   NST was performed and has been reviewed by me.  NST INTERPRETATION: Category I  Mode: External Baseline Rate (A): 120 bpm Variability: Moderate Accelerations: 15 x 15 Decelerations: None     Contraction Frequency (min): rare/irritability  ASSESSMENT Impression:  1.  Pregnancy:  [id number] at 102w6d , EDD Estimated Date of Delivery: 05/21/19 2.  NST:  Category I  PLAN 1. Reassurance given. Encouraged use of  belly band, heat/ice to her back and tylenol PRN. PT has appointment in the office tomorrow. Discussed increased vaginal discharge in pregnancy.  2. Discharge home with standard labor precautions given to return to L&D or call the office for problems. 3. Continue routine prenatal care- follow up in office tomorrow as scheduled.   Doreene Burke, CNM

## 2019-04-22 NOTE — OB Triage Note (Signed)
Discharge instructions provided and reviewed.  Follow up care discussed.  Comfort measures reviewed.  Pt verbalizes understanding.

## 2019-04-23 ENCOUNTER — Ambulatory Visit (INDEPENDENT_AMBULATORY_CARE_PROVIDER_SITE_OTHER): Payer: Medicare Other

## 2019-04-23 ENCOUNTER — Encounter: Payer: Medicare Other | Admitting: Obstetrics and Gynecology

## 2019-04-23 DIAGNOSIS — O26843 Uterine size-date discrepancy, third trimester: Secondary | ICD-10-CM

## 2019-04-23 DIAGNOSIS — Z3A36 36 weeks gestation of pregnancy: Secondary | ICD-10-CM

## 2019-04-23 DIAGNOSIS — Z3483 Encounter for supervision of other normal pregnancy, third trimester: Secondary | ICD-10-CM

## 2019-04-24 ENCOUNTER — Encounter: Payer: Medicare Other | Admitting: Obstetrics and Gynecology

## 2019-04-25 ENCOUNTER — Telehealth: Payer: Self-pay

## 2019-04-25 NOTE — Telephone Encounter (Signed)
Coronavirus (COVID-19) Are you at risk?  Are you at risk for the Coronavirus (COVID-19)?  To be considered HIGH RISK for Coronavirus (COVID-19), you have to meet the following criteria:  . Traveled to [place], [place], [place], [place] or [place]; or in the [place] to [place], [place], [place], or [place]; and have fever, cough, and shortness of breath within the last 2 weeks of travel OR . Been in close contact with a person diagnosed with COVID-19 within the last 2 weeks and have fever, cough, and shortness of breath . IF YOU DO NOT MEET THESE CRITERIA, YOU ARE CONSIDERED LOW RISK FOR COVID-19.  What to do if you are HIGH RISK for COVID-19?  . If you are having a medical emergency, call 911. . Seek medical care right away. Before you go to a doctor's office, urgent care or emergency department, call ahead and tell them about your recent travel, contact with someone diagnosed with COVID-19, and your symptoms. You should receive instructions from your physician's office regarding next steps of care.  . When you arrive at healthcare provider, tell the healthcare staff immediately you have returned from visiting [place], [place], [place], [place] or [place]; or traveled in the [place] to [place], [place], [place], or [place]; in the last two weeks or you have been in close contact with a person diagnosed with COVID-19 in the last 2 weeks.   . Tell the health care staff about your symptoms: fever, cough and shortness of breath. . After you have been seen by a medical provider, you will be either: o Tested for (COVID-19) and discharged home on quarantine except to seek medical care if symptoms worsen, and asked to  - Stay home and avoid contact with others until you get your results (4-5 days)  - Avoid travel on public transportation if possible (such as bus, train, or airplane) or o Sent to the Emergency Department by EMS for evaluation, COVID-19 testing, and possible  admission depending on your condition and test results.  What to do if you are LOW RISK for COVID-19?  Reduce your risk of any infection by using the same precautions used for avoiding the common cold or flu:  . Wash your hands often with soap and warm water for at least 20 seconds.  If soap and water are not readily available, use an alcohol-based hand sanitizer with at least 60% alcohol.  . If coughing or sneezing, cover your mouth and nose by coughing or sneezing into the elbow areas of your shirt or coat, into a tissue or into your sleeve (not your hands). . Avoid shaking hands with others and consider head nods or verbal greetings only. . Avoid touching your eyes, nose, or mouth with unwashed hands.  . Avoid close contact with people who are Renee [name]. . Avoid places or events with large numbers of people in one location, like concerts or sporting events. . Carefully consider travel plans you have or are making. . If you are planning any travel outside or inside the [place], visit the CDC's Travelers' Health webpage for the latest health notices. . If you have some symptoms but not all symptoms, continue to monitor at home and seek medical attention if your symptoms worsen. . If you are having a medical emergency, call 911.  04/25/19 SCREENING NEG SLS ADDITIONAL HEALTHCARE OPTIONS FOR PATIENTS  [facility] / e-Visit: [email or web address]         [facility]: [phone]    [facility]: [phone]                   [facility]: [phone]  

## 2019-04-26 ENCOUNTER — Encounter: Payer: Medicare Other | Admitting: Obstetrics and Gynecology

## 2019-04-26 ENCOUNTER — Telehealth: Payer: Self-pay

## 2019-04-26 NOTE — Telephone Encounter (Signed)
Coronavirus (COVID-19) Are you at risk?  Are you at risk for the Coronavirus (COVID-19)?  To be considered HIGH RISK for Coronavirus (COVID-19), you have to meet the following criteria:  . Traveled to [place], [place], [place], [place] or [place]; or in the [place] to [place], [place], [place], or [place]; and have fever, cough, and shortness of breath within the last 2 weeks of travel OR . Been in close contact with a person diagnosed with COVID-19 within the last 2 weeks and have fever, cough, and shortness of breath . IF YOU DO NOT MEET THESE CRITERIA, YOU ARE CONSIDERED LOW RISK FOR COVID-19.  What to do if you are HIGH RISK for COVID-19?  . If you are having a medical emergency, call 911. . Seek medical care right away. Before you go to a doctor's office, urgent care or emergency department, call ahead and tell them about your recent travel, contact with someone diagnosed with COVID-19, and your symptoms. You should receive instructions from your physician's office regarding next steps of care.  . When you arrive at healthcare provider, tell the healthcare staff immediately you have returned from visiting [place], [place], [place], [place] or [place]; or traveled in the [place] to [place], [place], [place], or [place]; in the last two weeks or you have been in close contact with a person diagnosed with COVID-19 in the last 2 weeks.   . Tell the health care staff about your symptoms: fever, cough and shortness of breath. . After you have been seen by a medical provider, you will be either: o Tested for (COVID-19) and discharged home on quarantine except to seek medical care if symptoms worsen, and asked to  - Stay home and avoid contact with others until you get your results (4-5 days)  - Avoid travel on public transportation if possible (such as bus, train, or airplane) or o Sent to the Emergency Department by EMS for evaluation, COVID-19 testing, and possible  admission depending on your condition and test results.  What to do if you are LOW RISK for COVID-19?  Reduce your risk of any infection by using the same precautions used for avoiding the common cold or flu:  . Wash your hands often with soap and warm water for at least 20 seconds.  If soap and water are not readily available, use an alcohol-based hand sanitizer with at least 60% alcohol.  . If coughing or sneezing, cover your mouth and nose by coughing or sneezing into the elbow areas of your shirt or coat, into a tissue or into your sleeve (not your hands). . Avoid shaking hands with others and consider head nods or verbal greetings only. . Avoid touching your eyes, nose, or mouth with unwashed hands.  . Avoid close contact with people who are Japji Kok. . Avoid places or events with large numbers of people in one location, like concerts or sporting events. . Carefully consider travel plans you have or are making. . If you are planning any travel outside or inside the [place], visit the CDC's Travelers' Health webpage for the latest health notices. . If you have some symptoms but not all symptoms, continue to monitor at home and seek medical attention if your symptoms worsen. . If you are having a medical emergency, call 911.  04/26/19 SCREENING NEG SLS ADDITIONAL HEALTHCARE OPTIONS FOR PATIENTS  [facility] / e-Visit: [email or web address]         [facility]: [phone]    [facility]: [phone]                   [facility]: [phone]  

## 2019-04-26 NOTE — Telephone Encounter (Signed)
Coronavirus (COVID-19) Are you at risk?  Are you at risk for the Coronavirus (COVID-19)?  To be considered HIGH RISK for Coronavirus (COVID-19), you have to meet the following criteria:  . Traveled to [place], [place], [place], [place] or [place]; or in the [place] to [place], [place], [place], or [place]; and have fever, cough, and shortness of breath within the last 2 weeks of travel OR . Been in close contact with a person diagnosed with COVID-19 within the last 2 weeks and have fever, cough, and shortness of breath . IF YOU DO NOT MEET THESE CRITERIA, YOU ARE CONSIDERED LOW RISK FOR COVID-19.  What to do if you are HIGH RISK for COVID-19?  . If you are having a medical emergency, call 911. . Seek medical care right away. Before you go to a doctor's office, urgent care or emergency department, call ahead and tell them about your recent travel, contact with someone diagnosed with COVID-19, and your symptoms. You should receive instructions from your physician's office regarding next steps of care.  . When you arrive at healthcare provider, tell the healthcare staff immediately you have returned from visiting [place], [place], [place], [place] or [place]; or traveled in the [place] to [place], [place], [place], or [place]; in the last two weeks or you have been in close contact with a person diagnosed with COVID-19 in the last 2 weeks.   . Tell the health care staff about your symptoms: fever, cough and shortness of breath. . After you have been seen by a medical provider, you will be either: o Tested for (COVID-19) and discharged home on quarantine except to seek medical care if symptoms worsen, and asked to  - Stay home and avoid contact with others until you get your results (4-5 days)  - Avoid travel on public transportation if possible (such as bus, train, or airplane) or o Sent to the Emergency Department by EMS for evaluation, COVID-19 testing, and possible  admission depending on your condition and test results.  What to do if you are LOW RISK for COVID-19?  Reduce your risk of any infection by using the same precautions used for avoiding the common cold or flu:  . Wash your hands often with soap and warm water for at least 20 seconds.  If soap and water are not readily available, use an alcohol-based hand sanitizer with at least 60% alcohol.  . If coughing or sneezing, cover your mouth and nose by coughing or sneezing into the elbow areas of your shirt or coat, into a tissue or into your sleeve (not your hands). . Avoid shaking hands with others and consider head nods or verbal greetings only. . Avoid touching your eyes, nose, or mouth with unwashed hands.  . Avoid close contact with people who are Kaoir Loree. . Avoid places or events with large numbers of people in one location, like concerts or sporting events. . Carefully consider travel plans you have or are making. . If you are planning any travel outside or inside the [place], visit the CDC's Travelers' Health webpage for the latest health notices. . If you have some symptoms but not all symptoms, continue to monitor at home and seek medical attention if your symptoms worsen. . If you are having a medical emergency, call 911.  04/26/19 SCREENING NEG SLS ADDITIONAL HEALTHCARE OPTIONS FOR PATIENTS  [facility] / e-Visit: [email or web address]         [facility]: [phone]    [facility]: [phone]                   [facility]: [phone]  

## 2019-04-29 ENCOUNTER — Encounter: Payer: Medicare Other | Admitting: Certified Nurse Midwife

## 2019-04-29 ENCOUNTER — Telehealth: Payer: Self-pay

## 2019-04-29 NOTE — Telephone Encounter (Signed)
Coronavirus (COVID-19) Are you at risk?  Are you at risk for the Coronavirus (COVID-19)?  To be considered HIGH RISK for Coronavirus (COVID-19), you have to meet the following criteria:  . Traveled to [place], [place], [place], [place] or [place]; or in the [place] to [place], [place], [place], or [place]; and have fever, cough, and shortness of breath within the last 2 weeks of travel OR . Been in close contact with a person diagnosed with COVID-19 within the last 2 weeks and have fever, cough, and shortness of breath . IF YOU DO NOT MEET THESE CRITERIA, YOU ARE CONSIDERED LOW RISK FOR COVID-19.  What to do if you are HIGH RISK for COVID-19?  . If you are having a medical emergency, call 911. . Seek medical care right away. Before you go to a doctor's office, urgent care or emergency department, call ahead and tell them about your recent travel, contact with someone diagnosed with COVID-19, and your symptoms. You should receive instructions from your physician's office regarding next steps of care.  . When you arrive at healthcare provider, tell the healthcare staff immediately you have returned from visiting [place], [place], [place], [place] or [place]; or traveled in the [place] to [place], [place], [place], or [place]; in the last two weeks or you have been in close contact with a person diagnosed with COVID-19 in the last 2 weeks.   . Tell the health care staff about your symptoms: fever, cough and shortness of breath. . After you have been seen by a medical provider, you will be either: o Tested for (COVID-19) and discharged home on quarantine except to seek medical care if symptoms worsen, and asked to  - Stay home and avoid contact with others until you get your results (4-5 days)  - Avoid travel on public transportation if possible (such as bus, train, or airplane) or o Sent to the Emergency Department by EMS for evaluation, COVID-19 testing, and possible  admission depending on your condition and test results.  What to do if you are LOW RISK for COVID-19?  Reduce your risk of any infection by using the same precautions used for avoiding the common cold or flu:  . Wash your hands often with soap and warm water for at least 20 seconds.  If soap and water are not readily available, use an alcohol-based hand sanitizer with at least 60% alcohol.  . If coughing or sneezing, cover your mouth and nose by coughing or sneezing into the elbow areas of your shirt or coat, into a tissue or into your sleeve (not your hands). . Avoid shaking hands with others and consider head nods or verbal greetings only. . Avoid touching your eyes, nose, or mouth with unwashed hands.  . Avoid close contact with people who are Jaquez Farrington. . Avoid places or events with large numbers of people in one location, like concerts or sporting events. . Carefully consider travel plans you have or are making. . If you are planning any travel outside or inside the [place], visit the CDC's Travelers' Health webpage for the latest health notices. . If you have some symptoms but not all symptoms, continue to monitor at home and seek medical attention if your symptoms worsen. . If you are having a medical emergency, call 911.  04/29/19 SCREENING NEG SLS ADDITIONAL HEALTHCARE OPTIONS FOR PATIENTS  [facility] / e-Visit: [email or web address]         [facility]: [phone]    [facility]: [phone]                   [name]: [phone]  

## 2019-04-30 ENCOUNTER — Other Ambulatory Visit: Payer: Self-pay

## 2019-04-30 ENCOUNTER — Ambulatory Visit (INDEPENDENT_AMBULATORY_CARE_PROVIDER_SITE_OTHER): Payer: Medicare Other | Admitting: Certified Nurse Midwife

## 2019-04-30 VITALS — BP 106/51 | HR 75 | Wt 164.4 lb

## 2019-04-30 DIAGNOSIS — Z3483 Encounter for supervision of other normal pregnancy, third trimester: Secondary | ICD-10-CM

## 2019-04-30 NOTE — Progress Notes (Signed)
ROB doing well. Feels good movement. GBS and cultures today. SVE-pt was unable to tolerate exam . Leopolds suggest vertex position. Labor signs and symptoms reviewed. Pt complains of pelvic pressure, reassurance given. Encouraged use of belly band. Follow up 1 wk with Melody .   Doreene Burke, CNM

## 2019-04-30 NOTE — Patient Instructions (Signed)
Group B Streptococcus Infection During Pregnancy  Group B Streptococcus (GBS) is a type of bacteria (Streptococcus agalactiae) that is often found in healthy people, commonly in the rectum, vagina, and intestines. In people who are healthy and not pregnant, the bacteria rarely cause serious illness or complications. However, women who test positive for GBS during pregnancy can pass the bacteria to their baby during childbirth, which can cause serious infection in the baby after birth. Women with GBS may also have infections during their pregnancy or immediately after childbirth, such as such as urinary tract infections (UTIs) or infections of the uterus (uterine infections). Having GBS also increases a woman's risk of complications during pregnancy, such as early (preterm) labor or delivery, miscarriage, or stillbirth. Routine testing (screening) for GBS is recommended for all pregnant women. What increases the risk? You may have a higher risk for GBS infection during pregnancy if you had one during a past pregnancy. What are the signs or symptoms? In most cases, GBS infection does not cause symptoms in pregnant women. Signs and symptoms of a possible GBS-related infection may include:  Labor starting before the 37th week of pregnancy.  A UTI or bladder infection, which may cause: ? Fever. ? Pain or burning during urination. ? Frequent urination.  Fever during labor, along with: ? Bad-smelling discharge. ? Uterine tenderness. ? Rapid heartbeat in the mother, baby, or both. Rare but serious symptoms of a possible GBS-related infection in women include:  Blood infection (septicemia). This may cause fever, chills, or confusion.  Lung infection (pneumonia). This may cause fever, chills, cough, rapid breathing, difficulty breathing, or chest pain.  Bone, joint, skin, or soft tissue infection. How is this diagnosed? You may be screened for GBS between week 35 and week 37 of your pregnancy. If  you have symptoms of preterm labor, you may be screened earlier. This condition is diagnosed based on lab test results from:  A swab of fluid from the vagina and rectum.  A urine sample. How is this treated? This condition is treated with antibiotic medicine. When you go into labor, or as soon as your water breaks (your membranes rupture), you will be given antibiotics through an IV tube. Antibiotics will continue until after you give birth. If you are having a cesarean delivery, you do not need antibiotics unless your membranes have already ruptured. Follow these instructions at home:  Take over-the-counter and prescription medicines only as told by your health care provider.  Take your antibiotic medicine as told by your health care provider. Do not stop taking the antibiotic even if you start to feel better.  Keep all pre-birth (prenatal) visits and follow-up visits as told by your health care provider. This is important. Contact a health care provider if:  You have pain or burning when you urinate.  You have to urinate frequently.  You have a fever or chills.  You develop a bad-smelling vaginal discharge. Get help right away if:  Your membranes rupture.  You go into labor.  You have severe pain in your abdomen.  You have difficulty breathing.  You have chest pain. This information is not intended to replace advice given to you by your health care provider. Make sure you discuss any questions you have with your health care provider. Document Released: 02/21/2008 Document Revised: 06/10/2016 Document Reviewed: 06/09/2016 Elsevier Interactive Patient Education  2019 [employer] Inc.  

## 2019-05-04 LAB — STREP GP B CULTURE+RFLX: Strep Gp B Culture+Rflx: NEGATIVE

## 2019-05-05 ENCOUNTER — Inpatient Hospital Stay
Admission: EM | Admit: 2019-05-05 | Discharge: 2019-05-05 | Disposition: A | Discharge status: Home / Self Care | Payer: Medicare Other | Attending: Obstetrics and Gynecology | Admitting: Obstetrics and Gynecology

## 2019-05-05 ENCOUNTER — Other Ambulatory Visit: Payer: Self-pay

## 2019-05-05 DIAGNOSIS — Z3A37 37 weeks gestation of pregnancy: Secondary | ICD-10-CM

## 2019-05-05 DIAGNOSIS — N390 Urinary tract infection, site not specified: Secondary | ICD-10-CM

## 2019-05-05 DIAGNOSIS — M549 Dorsalgia, unspecified: Secondary | ICD-10-CM | POA: Diagnosis present

## 2019-05-05 DIAGNOSIS — O26893 Other specified pregnancy related conditions, third trimester: Secondary | ICD-10-CM | POA: Insufficient documentation

## 2019-05-05 LAB — URINALYSIS, ROUTINE W REFLEX MICROSCOPIC
Bilirubin Urine: NEGATIVE
Glucose, UA: NEGATIVE mg/dL
Hgb urine dipstick: NEGATIVE
Ketones, ur: NEGATIVE mg/dL
Nitrite: NEGATIVE
Protein, ur: NEGATIVE mg/dL
Specific Gravity, Urine: 1.005 (ref 1.005–1.030)
pH: 6 (ref 5.0–8.0)

## 2019-05-05 MED ORDER — NITROFURANTOIN MONOHYD MACRO 100 MG PO CAPS: 100.0000 mg | ORAL_CAPSULE | Freq: Two times a day (BID) | ORAL | 0 refills | Status: DC

## 2019-05-05 MED ORDER — ACETAMINOPHEN 325 MG PO TABS
650.0000 mg | ORAL_TABLET | Freq: Once | ORAL | Status: AC
  Administered 2019-05-05: 650 mg via ORAL
  Filled 2019-05-05: qty 2

## 2019-05-05 MED ORDER — NITROFURANTOIN MONOHYD MACRO 100 MG PO CAPS
100.0000 mg | ORAL_CAPSULE | Freq: Two times a day (BID) | ORAL | Status: DC
  Administered 2019-05-05: 100 mg via ORAL
  Filled 2019-05-05: qty 1

## 2019-05-05 NOTE — Discharge Summary (Signed)
Renee [name] is a 28 y.o. G3P2002 at [redacted]w[redacted]d who is admitted for observation due to back pain for 2 hours.  Estimated Date of Delivery: 05/21/19 Fetal presentation is cephalic.  Length of Stay:  0 Days. Admitted 05/05/2019  Subjective: Reports back pain is better after 650 tylenol. Patient reports good fetal movement.  She reports no known uterine contractions, no bleeding and no loss of fluid per vagina.  Vitals:  Blood pressure 104/62, pulse 78, temperature 98 F (36.7 C), temperature source Oral, resp. rate 18, height 5\' 3"  (1.6 m), weight 74.4 kg, last menstrual period 08/16/2018. Physical Examination: CONSTITUTIONAL: Well-developed, well-nourished female in no acute distress.  SKIN: Skin is warm and dry. No rash noted. Not diaphoretic. No erythema. No pallor. [place]: Alert and oriented to person, place, and time. Normal reflexes, muscle tone coordination. No cranial nerve deficit noted. PSYCHIATRIC: Normal mood and affect. Normal behavior. Normal judgment and thought content. CARDIOVASCULAR: Normal heart rate noted, regular rhythm RESPIRATORY: Effort and breath sounds normal, no problems with respiration noted MUSCULOSKELETAL: Normal range of motion. Moderate non-pitting edema and no tenderness. 2+ distal pulses. ABDOMEN: Soft, nontender, nondistended, gravid. CERVIX: Dilation: Fingertip Effacement (%): Thick Cervical Position: Posterior Station: -3 Exam by:: D. Corse RN  Fetal monitoring: FHR: 150 bpm, Variability: moderate, Accelerations: Present, Decelerations: Absent  Uterine activity: 2-4 contractions per hour, mild to palpation  Results for orders placed or performed during the hospital encounter of 05/05/19 (from the past 48 hour(s))  Urinalysis, Routine w reflex microscopic     Status: Abnormal   Collection Time: 05/05/19  9:44 PM  Result Value Ref Range   Color, Urine YELLOW (A) YELLOW   APPearance HAZY (A) CLEAR   Specific Gravity, Urine 1.005 1.005 - 1.030   pH 6.0  5.0 - 8.0   Glucose, UA NEGATIVE NEGATIVE mg/dL   Hgb urine dipstick NEGATIVE NEGATIVE   Bilirubin Urine NEGATIVE NEGATIVE   Ketones, ur NEGATIVE NEGATIVE mg/dL   Protein, ur NEGATIVE NEGATIVE mg/dL   Nitrite NEGATIVE NEGATIVE   Leukocytes,Ua SMALL (A) NEGATIVE   RBC / HPF 0-5 0 - 5 RBC/hpf   WBC, UA 6-10 0 - 5 WBC/hpf   Bacteria, UA RARE (A) NONE SEEN   Squamous Epithelial / LPF 0-5 0 - 5    Comment: Performed at [facility], [address] [place], [place] [address]    No results found.  Current scheduled medications . nitrofurantoin (macrocrystal-monohydrate)  100 mg Oral Q12H    I have reviewed the patient's current medications.  ASSESSMENT: Patient Active Problem List   Diagnosis Date Noted  . Labor and delivery, indication for care 04/22/2019  . Subchorionic hematoma in second trimester 11/29/2018  . Pregnant 11/13/2018  . History of low transverse cesarean section 11/13/2018    PLAN: First dose macrobid given and prescription sent to pharmacy. Instructed to return if symptoms worsen. Is still undecided about VBAC. Continue routine antenatal   Kohle Winner N Ahad Colarusso, CNM [facility]

## 2019-05-05 NOTE — Discharge Instructions (Signed)
Pregnancy and Urinary Tract Infection  What is a urinary tract infection?    A urinary tract infection (UTI) is an infection of any part of the urinary tract. This includes the kidneys, the tubes that connect your kidneys to your bladder (ureters), the bladder, and the tube that carries urine out of your body (urethra). These organs make, store, and get rid of urine in the body.   An upper UTI affects the ureters and kidneys (pyelonephritis), and a lower UTI affects the bladder (cystitis) and urethra (urethritis). Most urinary tract infections are caused by bacteria in your genital area, around the entrance to your urinary tract (urethra). These bacteria grow and cause irritation and inflammation of your urinary tract.  Why am I more likely to get a UTI during pregnancy?  You are more likely to develop a UTI during pregnancy because:  · The physical and hormonal changes your body goes through can make it easier for bacteria to get into your urinary tract.  · Your growing baby puts pressure on your uterus and can affect urine flow.  Does a UTI place my baby at risk?  An untreated UTI during pregnancy could lead to a kidney infection, which can cause health problems that could affect your baby. Possible complications of an untreated UTI include:  · Having your baby before 37 weeks of pregnancy (premature).  · Having a baby with a low birth weight.  · Developing high blood pressure during pregnancy (preeclampsia).  · Having a low hemoglobin level (anemia).  What are the symptoms of a UTI?  Symptoms of a UTI include:  · Needing to urinate right away (urgently).  · Frequent urination or passing small amounts of urine frequently.  · Pain or burning with urination.  · Blood in the urine.  · Urine that smells bad or unusual.  · Trouble urinating.  · Cloudy urine.  · Pain in the abdomen or lower back.  · Vaginal discharge.  You may also have:  · Vomiting or a decreased appetite.  · Confusion.  · Irritability or  tiredness.  · A fever.  · Diarrhea.  What are the treatment options for a UTI during pregnancy?  Treatment for this condition may include:  · Antibiotic medicines that are safe to take during pregnancy.  · Other medicines to treat less common causes of UTI.  How can I prevent a UTI?  To prevent a UTI:  · Go to the bathroom as soon as you feel the need. Do not hold urine for long periods of time.  · Always wipe from front to back after a bowel movement. Use each tissue one time when you wipe.  · Empty your bladder after sex.  · Keep your genital area dry.  · Drink 6-10 glasses of water each day.  · Do not douche or use deodorant sprays.  Contact a health care provider if:  · Your symptoms do not improve or they get worse.  · You have abnormal vaginal discharge.  Get help right away if:  · You have a fever.  · You have nausea and vomiting.  · You have back or side pain.  · You feel contractions in your uterus.  · You have lower belly pain.  · You have a gush of fluid from your vagina.  · You have blood in your urine.  Summary  · A urinary tract infection (UTI) is an infection of any part of the urinary tract, which includes the kidneys, ureters,   bladder, and urethra.  · Most urinary tract infections are caused by bacteria in your genital area, around the entrance to your urinary tract (urethra).  · You are more likely to develop a UTI during pregnancy.  · If you were prescribed an antibiotic, take it as told by your health care provider. Do not stop taking the antibiotic even if you start to feel better.  This information is not intended to replace advice given to you by your health care provider. Make sure you discuss any questions you have with your health care provider.  Document Released: 03/11/2011 Document Revised: 01/09/2018 Document Reviewed: 10/05/2015  Elsevier Interactive Patient Education © 2019 [employer]

## 2019-05-05 NOTE — OB Triage Note (Signed)
Pt is a G3P2 previos c-section x2  seen at 37wk5d w/ c/o of back pain that started around [place] and has progressively gotten worst.Pt states her back pain has been every 1-2 minutes. Pt rates her pain a 10/10. Pt denies vaginal bleeding and LOF. Pt states positive fetal movement. FHT 160. Monitors applied and assesing. Will continue to monitor.

## 2019-05-06 LAB — GC/CHLAMYDIA PROBE AMP
Chlamydia trachomatis, NAA: NEGATIVE
Neisseria Gonorrhoeae by PCR: NEGATIVE

## 2019-05-07 LAB — URINE CULTURE: Culture: >=100000 — AB

## 2019-05-08 ENCOUNTER — Other Ambulatory Visit: Payer: Self-pay | Admitting: Obstetrics and Gynecology

## 2019-05-08 MED ORDER — CITRANATAL BLOOM 90-1 MG PO TABS: 1.0000 | ORAL_TABLET | Freq: Every day | ORAL | 11 refills | Status: DC

## 2019-05-08 MED ORDER — FLUCONAZOLE 100 MG PO TABS: 100.0000 mg | ORAL_TABLET | Freq: Every day | ORAL | 0 refills | Status: DC

## 2019-05-09 ENCOUNTER — Other Ambulatory Visit: Payer: Self-pay

## 2019-05-09 ENCOUNTER — Ambulatory Visit (INDEPENDENT_AMBULATORY_CARE_PROVIDER_SITE_OTHER): Payer: Medicare Other | Admitting: Obstetrics and Gynecology

## 2019-05-09 VITALS — BP 112/68 | HR 98 | Wt 170.5 lb

## 2019-05-09 DIAGNOSIS — Z3493 Encounter for supervision of normal pregnancy, unspecified, third trimester: Secondary | ICD-10-CM

## 2019-05-09 DIAGNOSIS — D649 Anemia, unspecified: Secondary | ICD-10-CM

## 2019-05-09 DIAGNOSIS — R600 Localized edema: Secondary | ICD-10-CM

## 2019-05-09 DIAGNOSIS — O9989 Other specified diseases and conditions complicating pregnancy, childbirth and the puerperium: Secondary | ICD-10-CM

## 2019-05-09 LAB — POCT URINALYSIS DIPSTICK OB
Bilirubin, UA: NEGATIVE
Blood, UA: NEGATIVE
Glucose, UA: NEGATIVE
Ketones, UA: NEGATIVE
Leukocytes, UA: NEGATIVE
Nitrite, UA: NEGATIVE
POC,PROTEIN,UA: NEGATIVE
Spec Grav, UA: 1.015 (ref 1.010–1.025)
Urobilinogen, UA: 0.2 E.U./dL
pH, UA: 7 (ref 5.0–8.0)

## 2019-05-09 NOTE — Progress Notes (Signed)
ROB- labs obtained, will see Dr Marcelline Mates tomorrow as she has decided to have repeat c/s. Declined pelvic exam.

## 2019-05-09 NOTE — Progress Notes (Signed)
ROB- pt is having pelvic pressure, B feet swelling, "not sure if she wants a c-section or not"

## 2019-05-10 ENCOUNTER — Ambulatory Visit (INDEPENDENT_AMBULATORY_CARE_PROVIDER_SITE_OTHER): Payer: Medicare Other | Admitting: Obstetrics and Gynecology

## 2019-05-10 ENCOUNTER — Encounter: Payer: Self-pay | Admitting: Obstetrics and Gynecology

## 2019-05-10 VITALS — BP 121/64 | HR 73 | Wt 170.7 lb

## 2019-05-10 DIAGNOSIS — Z3483 Encounter for supervision of other normal pregnancy, third trimester: Secondary | ICD-10-CM

## 2019-05-10 LAB — CBC
Hematocrit: 25.2 % — ABNORMAL LOW (ref 34.0–46.6)
Hemoglobin: 7.6 g/dL — ABNORMAL LOW (ref 11.1–15.9)
MCH: 20.7 pg — ABNORMAL LOW (ref 26.6–33.0)
MCHC: 30.2 g/dL — ABNORMAL LOW (ref 31.5–35.7)
MCV: 69 fL — ABNORMAL LOW (ref 79–97)
Platelets: 194 10*3/uL (ref 150–450)
RBC: 3.67 x10E6/uL — ABNORMAL LOW (ref 3.77–5.28)
RDW: 15.3 % (ref 11.7–15.4)
WBC: 8.5 10*3/uL (ref 3.4–10.8)

## 2019-05-10 LAB — COMPREHENSIVE METABOLIC PANEL
ALT: 19 IU/L (ref 0–32)
AST: 22 IU/L (ref 0–40)
Albumin/Globulin Ratio: 1.5 (ref 1.2–2.2)
Albumin: 3.1 g/dL — ABNORMAL LOW (ref 3.9–5.0)
Alkaline Phosphatase: 129 IU/L — ABNORMAL HIGH (ref 39–117)
BUN/Creatinine Ratio: 19 (ref 9–23)
BUN: 9 mg/dL (ref 6–20)
Bilirubin Total: 0.5 mg/dL (ref 0.0–1.2)
CO2: 21 mmol/L (ref 20–29)
Calcium: 9 mg/dL (ref 8.7–10.2)
Chloride: 103 mmol/L (ref 96–106)
Creatinine, Ser: 0.47 mg/dL — ABNORMAL LOW (ref 0.57–1.00)
GFR calc Af Amer: 159 mL/min/{1.73_m2} (ref >59)
GFR calc non Af Amer: 138 mL/min/{1.73_m2} (ref >59)
Globulin, Total: 2.1 g/dL (ref 1.5–4.5)
Glucose: 116 mg/dL — ABNORMAL HIGH (ref 65–99)
Potassium: 4.3 mmol/L (ref 3.5–5.2)
Sodium: 138 mmol/L (ref 134–144)
Total Protein: 5.2 g/dL — ABNORMAL LOW (ref 6.0–8.5)

## 2019-05-10 LAB — POCT URINALYSIS DIPSTICK OB
Bilirubin, UA: NEGATIVE
Blood, UA: NEGATIVE
Glucose, UA: NEGATIVE
Ketones, UA: NEGATIVE
Leukocytes, UA: NEGATIVE
Nitrite, UA: NEGATIVE
POC,PROTEIN,UA: NEGATIVE
Spec Grav, UA: 1.015 (ref 1.010–1.025)
Urobilinogen, UA: 1 E.U./dL
pH, UA: 6.5 (ref 5.0–8.0)

## 2019-05-10 NOTE — Progress Notes (Signed)
ROB-Pt is present today for prenatal care. Pt stated that her feet has been swollen for 2 months.

## 2019-05-10 NOTE — Progress Notes (Signed)
ROB: Patient presents again from midwifery service. She initially desired TOLAC, was seen on MD service for counseling ~ 1 month ago. Patient now notes that she no longer desires TOLAC as she is tired of being pregnant. Reports leg swelling and abdominal discomfort and does not desire to wait until onset of labor. Counseled again on risks/benefits of repeat C-section. Will attempt to schedule at 39 weeks. Patient prefers either Wednesday of Friday of next week. Significant anemia noted on most recent labs (Hgb 7.9). Patient may require blood transfusion intraop or postpartum. Has previously signed blood consent.

## 2019-05-14 MED ORDER — CEFAZOLIN SODIUM-DEXTROSE 2-4 GM/100ML-% IV SOLN
2.0000 g | INTRAVENOUS | Status: AC
  Administered 2019-05-15: 17:00:00 2 g via INTRAVENOUS
  Filled 2019-05-14: qty 100

## 2019-05-15 ENCOUNTER — Inpatient Hospital Stay
Admission: RE | Admit: 2019-05-15 | Discharge: 2019-05-18 | DRG: 787 | Disposition: A | Discharge status: Home / Self Care | Payer: Medicare Other | Attending: Obstetrics and Gynecology | Admitting: Obstetrics and Gynecology

## 2019-05-15 ENCOUNTER — Inpatient Hospital Stay: Payer: Medicare Other | Admitting: Anesthesiology

## 2019-05-15 ENCOUNTER — Other Ambulatory Visit: Payer: Self-pay

## 2019-05-15 ENCOUNTER — Encounter
Admission: RE | Disposition: A | Discharge status: Home / Self Care | Payer: Self-pay | Source: Home / Self Care | Attending: Obstetrics and Gynecology

## 2019-05-15 DIAGNOSIS — F1120 Opioid dependence, uncomplicated: Secondary | ICD-10-CM

## 2019-05-15 DIAGNOSIS — O34211 Maternal care for low transverse scar from previous cesarean delivery: Principal | ICD-10-CM | POA: Diagnosis present

## 2019-05-15 DIAGNOSIS — Z880 Allergy status to penicillin: Secondary | ICD-10-CM

## 2019-05-15 DIAGNOSIS — F1110 Opioid abuse, uncomplicated: Secondary | ICD-10-CM | POA: Diagnosis present

## 2019-05-15 DIAGNOSIS — O9902 Anemia complicating childbirth: Secondary | ICD-10-CM

## 2019-05-15 DIAGNOSIS — O9081 Anemia of the puerperium: Secondary | ICD-10-CM | POA: Diagnosis not present

## 2019-05-15 DIAGNOSIS — Z3A39 39 weeks gestation of pregnancy: Secondary | ICD-10-CM | POA: Diagnosis not present

## 2019-05-15 DIAGNOSIS — O99320 Drug use complicating pregnancy, unspecified trimester: Secondary | ICD-10-CM

## 2019-05-15 DIAGNOSIS — O99324 Drug use complicating childbirth: Secondary | ICD-10-CM | POA: Diagnosis present

## 2019-05-15 DIAGNOSIS — Z98891 History of uterine scar from previous surgery: Secondary | ICD-10-CM

## 2019-05-15 DIAGNOSIS — O99013 Anemia complicating pregnancy, third trimester: Secondary | ICD-10-CM | POA: Diagnosis present

## 2019-05-15 DIAGNOSIS — Z1159 Encounter for screening for other viral diseases: Secondary | ICD-10-CM

## 2019-05-15 DIAGNOSIS — Z23 Encounter for immunization: Secondary | ICD-10-CM | POA: Diagnosis present

## 2019-05-15 LAB — TYPE AND SCREEN
ABO/RH(D): A POS
Antibody Screen: NEGATIVE

## 2019-05-15 LAB — URINE DRUG SCREEN, QUALITATIVE (ARMC ONLY)
Amphetamines, Ur Screen: NOT DETECTED
Barbiturates, Ur Screen: NOT DETECTED
Benzodiazepine, Ur Scrn: NOT DETECTED
Cannabinoid 50 Ng, Ur ~~LOC~~: NOT DETECTED
Cocaine Metabolite,Ur ~~LOC~~: NOT DETECTED
MDMA (Ecstasy)Ur Screen: NOT DETECTED
Methadone Scn, Ur: NOT DETECTED
Opiate, Ur Screen: NOT DETECTED
Phencyclidine (PCP) Ur S: NOT DETECTED
Tricyclic, Ur Screen: NOT DETECTED

## 2019-05-15 LAB — ABO/RH: ABO/RH(D): A POS

## 2019-05-15 LAB — CBC
HCT: 27.7 % — ABNORMAL LOW (ref 36.0–46.0)
Hemoglobin: 8.2 g/dL — ABNORMAL LOW (ref 12.0–15.0)
MCH: 21 pg — ABNORMAL LOW (ref 26.0–34.0)
MCHC: 29.6 g/dL — ABNORMAL LOW (ref 30.0–36.0)
MCV: 70.8 fL — ABNORMAL LOW (ref 80.0–100.0)
Platelets: 252 10*3/uL (ref 150–400)
RBC: 3.91 MIL/uL (ref 3.87–5.11)
RDW: 15.9 % — ABNORMAL HIGH (ref 11.5–15.5)
WBC: 12.1 10*3/uL — ABNORMAL HIGH (ref 4.0–10.5)
nRBC: 0 % (ref 0.0–0.2)

## 2019-05-15 LAB — RAPID HIV SCREEN (HIV 1/2 AB+AG)
HIV 1/2 Antibodies: NONREACTIVE
HIV-1 P24 Antigen - HIV24: NONREACTIVE

## 2019-05-15 LAB — SARS CORONAVIRUS 2 BY RT PCR (HOSPITAL ORDER, PERFORMED IN ~~LOC~~ HOSPITAL LAB): SARS Coronavirus 2: NEGATIVE

## 2019-05-15 SURGERY — Surgical Case
Anesthesia: Spinal | Site: Abdomen

## 2019-05-15 MED ORDER — KETOROLAC TROMETHAMINE 30 MG/ML IJ SOLN: 30.0000 mg | Freq: Four times a day (QID) | INTRAMUSCULAR | Status: DC

## 2019-05-15 MED ORDER — PRENATAL MULTIVITAMIN CH
1.0000 | ORAL_TABLET | Freq: Every day | ORAL | Status: DC
  Administered 2019-05-16 – 2019-05-17 (×2): 1 via ORAL
  Filled 2019-05-15 (×2): qty 1

## 2019-05-15 MED ORDER — ACETAMINOPHEN 10 MG/ML IV SOLN
INTRAVENOUS | Status: DC | PRN
  Administered 2019-05-15: 1000 mg via INTRAVENOUS

## 2019-05-15 MED ORDER — MORPHINE SULFATE (PF) 0.5 MG/ML IJ SOLN
INTRAMUSCULAR | Status: DC | PRN
  Administered 2019-05-15: .1 mg via EPIDURAL

## 2019-05-15 MED ORDER — SODIUM CHLORIDE 0.9 % IV SOLN
INTRAVENOUS | Status: DC | PRN
  Administered 2019-05-15: 40 ug/min via INTRAVENOUS

## 2019-05-15 MED ORDER — TETANUS-DIPHTH-ACELL PERTUSSIS 5-2.5-18.5 LF-MCG/0.5 IM SUSP
0.5000 mL | Freq: Once | INTRAMUSCULAR | Status: AC
  Administered 2019-05-18: 0.5 mL via INTRAMUSCULAR
  Filled 2019-05-15: qty 0.5

## 2019-05-15 MED ORDER — NALOXONE HCL 4 MG/10ML IJ SOLN
1.0000 ug/kg/h | INTRAVENOUS | Status: DC | PRN
  Filled 2019-05-15: qty 5

## 2019-05-15 MED ORDER — TRAMADOL HCL 50 MG PO TABS: 100.0000 mg | ORAL_TABLET | Freq: Four times a day (QID) | ORAL | Status: DC | PRN

## 2019-05-15 MED ORDER — WITCH HAZEL-GLYCERIN EX PADS: 1.0000 "application " | MEDICATED_PAD | CUTANEOUS | Status: DC | PRN

## 2019-05-15 MED ORDER — NALOXONE HCL 0.4 MG/ML IJ SOLN: 0.4000 mg | INTRAMUSCULAR | Status: DC | PRN

## 2019-05-15 MED ORDER — ACETAMINOPHEN 500 MG PO TABS: 1000.0000 mg | ORAL_TABLET | Freq: Four times a day (QID) | ORAL | Status: DC

## 2019-05-15 MED ORDER — MAGNESIUM HYDROXIDE 400 MG/5ML PO SUSP: 30.0000 mL | ORAL | Status: DC | PRN

## 2019-05-15 MED ORDER — LIDOCAINE 5 % EX PTCH
MEDICATED_PATCH | CUTANEOUS | Status: DC | PRN
  Administered 2019-05-15: 1 via TRANSDERMAL

## 2019-05-15 MED ORDER — DIPHENHYDRAMINE HCL 25 MG PO CAPS: 25.0000 mg | ORAL_CAPSULE | Freq: Four times a day (QID) | ORAL | Status: DC | PRN

## 2019-05-15 MED ORDER — NON FORMULARY: 8.0000 mg | Freq: Two times a day (BID) | Status: DC

## 2019-05-15 MED ORDER — FERROUS SULFATE 325 (65 FE) MG PO TABS
325.0000 mg | ORAL_TABLET | Freq: Two times a day (BID) | ORAL | Status: DC
  Administered 2019-05-16 – 2019-05-18 (×5): 325 mg via ORAL
  Filled 2019-05-15 (×5): qty 1

## 2019-05-15 MED ORDER — LACTATED RINGERS IV SOLN
INTRAVENOUS | Status: DC
  Administered 2019-05-15: 11:00:00 via INTRAVENOUS

## 2019-05-15 MED ORDER — FENTANYL CITRATE (PF) 100 MCG/2ML IJ SOLN
INTRAMUSCULAR | Status: AC
  Filled 2019-05-15: qty 2

## 2019-05-15 MED ORDER — COCONUT OIL OIL: 1.0000 "application " | TOPICAL_OIL | Status: DC | PRN

## 2019-05-15 MED ORDER — MENTHOL 3 MG MT LOZG
1.0000 | LOZENGE | OROMUCOSAL | Status: DC | PRN
  Filled 2019-05-15: qty 9

## 2019-05-15 MED ORDER — DIBUCAINE (PERIANAL) 1 % EX OINT: 1.0000 "application " | TOPICAL_OINTMENT | CUTANEOUS | Status: DC | PRN

## 2019-05-15 MED ORDER — DIPHENHYDRAMINE HCL 25 MG PO CAPS: 25.0000 mg | ORAL_CAPSULE | ORAL | Status: DC | PRN

## 2019-05-15 MED ORDER — KETOROLAC TROMETHAMINE 30 MG/ML IJ SOLN
15.0000 mg | Freq: Four times a day (QID) | INTRAMUSCULAR | Status: AC
  Administered 2019-05-15 – 2019-05-17 (×6): 15 mg via INTRAVENOUS
  Filled 2019-05-15 (×6): qty 1

## 2019-05-15 MED ORDER — ACETAMINOPHEN 500 MG PO TABS
1000.0000 mg | ORAL_TABLET | Freq: Four times a day (QID) | ORAL | Status: AC
  Administered 2019-05-15 – 2019-05-17 (×6): 1000 mg via ORAL
  Filled 2019-05-15 (×7): qty 2

## 2019-05-15 MED ORDER — LACTATED RINGERS IV SOLN: INTRAVENOUS | Status: DC

## 2019-05-15 MED ORDER — SIMETHICONE 80 MG PO CHEW
80.0000 mg | CHEWABLE_TABLET | ORAL | Status: DC
  Administered 2019-05-16 – 2019-05-17 (×2): 80 mg via ORAL
  Filled 2019-05-15 (×3): qty 1

## 2019-05-15 MED ORDER — HYDROMORPHONE HCL 1 MG/ML IJ SOLN: 1.0000 mg | INTRAMUSCULAR | Status: DC | PRN

## 2019-05-15 MED ORDER — MEPERIDINE HCL 25 MG/ML IJ SOLN: 6.2500 mg | INTRAMUSCULAR | Status: DC | PRN

## 2019-05-15 MED ORDER — BUPRENORPHINE HCL 2 MG SL SUBL
8.0000 mg | SUBLINGUAL_TABLET | Freq: Two times a day (BID) | SUBLINGUAL | Status: AC
  Administered 2019-05-15 – 2019-05-17 (×4): 8 mg via SUBLINGUAL
  Filled 2019-05-15 (×4): qty 4

## 2019-05-15 MED ORDER — OXYTOCIN 40 UNITS IN NORMAL SALINE INFUSION - SIMPLE MED
INTRAVENOUS | Status: DC | PRN
  Administered 2019-05-15: 1000 mL via INTRAVENOUS

## 2019-05-15 MED ORDER — KETOROLAC TROMETHAMINE 30 MG/ML IJ SOLN
30.0000 mg | Freq: Once | INTRAMUSCULAR | Status: AC
  Administered 2019-05-15: 30 mg via INTRAVENOUS
  Filled 2019-05-15: qty 1

## 2019-05-15 MED ORDER — ONDANSETRON 4 MG PO TBDP: 4.0000 mg | ORAL_TABLET | Freq: Three times a day (TID) | ORAL | Status: DC | PRN

## 2019-05-15 MED ORDER — SOD CITRATE-CITRIC ACID 500-334 MG/5ML PO SOLN
30.0000 mL | ORAL | Status: AC
  Administered 2019-05-15: 30 mL via ORAL
  Filled 2019-05-15: qty 30

## 2019-05-15 MED ORDER — MORPHINE SULFATE (PF) 0.5 MG/ML IJ SOLN
INTRAMUSCULAR | Status: AC
  Filled 2019-05-15: qty 10

## 2019-05-15 MED ORDER — ONDANSETRON HCL 4 MG/2ML IJ SOLN
INTRAMUSCULAR | Status: DC | PRN
  Administered 2019-05-15: 4 mg via INTRAVENOUS

## 2019-05-15 MED ORDER — FENTANYL CITRATE (PF) 100 MCG/2ML IJ SOLN
INTRAMUSCULAR | Status: DC | PRN
  Administered 2019-05-15: 15 ug via INTRAVENOUS

## 2019-05-15 MED ORDER — IBUPROFEN 800 MG PO TABS: 800.0000 mg | ORAL_TABLET | Freq: Four times a day (QID) | ORAL | Status: DC

## 2019-05-15 MED ORDER — ONDANSETRON HCL 4 MG/2ML IJ SOLN: 4.0000 mg | Freq: Three times a day (TID) | INTRAMUSCULAR | Status: DC | PRN

## 2019-05-15 MED ORDER — SCOPOLAMINE 1 MG/3DAYS TD PT72
1.0000 | MEDICATED_PATCH | Freq: Once | TRANSDERMAL | Status: AC
  Administered 2019-05-15: 1.5 mg via TRANSDERMAL
  Filled 2019-05-15: qty 1

## 2019-05-15 MED ORDER — MEASLES, MUMPS & RUBELLA VAC IJ SOLR
0.5000 mL | Freq: Once | INTRAMUSCULAR | Status: AC
  Administered 2019-05-18: 0.5 mL via SUBCUTANEOUS
  Filled 2019-05-15 (×2): qty 0.5

## 2019-05-15 MED ORDER — SODIUM CHLORIDE 0.9% FLUSH: 3.0000 mL | INTRAVENOUS | Status: DC | PRN

## 2019-05-15 MED ORDER — SENNOSIDES-DOCUSATE SODIUM 8.6-50 MG PO TABS
2.0000 | ORAL_TABLET | ORAL | Status: DC
  Administered 2019-05-16 – 2019-05-18 (×3): 2 via ORAL
  Filled 2019-05-15 (×4): qty 2

## 2019-05-15 MED ORDER — LIDOCAINE 5 % EX PTCH
MEDICATED_PATCH | CUTANEOUS | Status: AC
  Filled 2019-05-15: qty 1

## 2019-05-15 MED ORDER — LIDOCAINE 5 % EX PTCH
1.0000 | MEDICATED_PATCH | CUTANEOUS | Status: DC
  Administered 2019-05-17: 1 via TRANSDERMAL
  Filled 2019-05-15: qty 1

## 2019-05-15 MED ORDER — MORPHINE SULFATE (PF) 2 MG/ML IV SOLN
INTRAVENOUS | Status: AC
  Administered 2019-05-15: 2 mg via INTRAVENOUS
  Filled 2019-05-15: qty 1

## 2019-05-15 MED ORDER — DIPHENHYDRAMINE HCL 50 MG/ML IJ SOLN: 12.5000 mg | Freq: Four times a day (QID) | INTRAMUSCULAR | Status: DC | PRN

## 2019-05-15 MED ORDER — ACETAMINOPHEN 10 MG/ML IV SOLN
INTRAVENOUS | Status: AC
  Filled 2019-05-15: qty 100

## 2019-05-15 MED ORDER — OXYTOCIN 40 UNITS IN NORMAL SALINE INFUSION - SIMPLE MED
INTRAVENOUS | Status: AC
  Filled 2019-05-15: qty 1000

## 2019-05-15 MED ORDER — DIPHENHYDRAMINE HCL 50 MG/ML IJ SOLN: 12.5000 mg | INTRAMUSCULAR | Status: DC | PRN

## 2019-05-15 MED ORDER — OXYTOCIN 40 UNITS IN NORMAL SALINE INFUSION - SIMPLE MED
2.5000 [IU]/h | INTRAVENOUS | Status: DC
  Administered 2019-05-15 (×2): 2.5 [IU]/h via INTRAVENOUS
  Filled 2019-05-15: qty 1000

## 2019-05-15 MED ORDER — ZOLPIDEM TARTRATE 5 MG PO TABS
5.0000 mg | ORAL_TABLET | Freq: Every evening | ORAL | Status: DC | PRN
  Administered 2019-05-17: 5 mg via ORAL
  Filled 2019-05-15: qty 1

## 2019-05-15 MED ORDER — ACETAMINOPHEN 500 MG PO TABS
1000.0000 mg | ORAL_TABLET | ORAL | Status: AC
  Administered 2019-05-15: 1000 mg via ORAL
  Filled 2019-05-15: qty 2

## 2019-05-15 MED ORDER — GABAPENTIN 300 MG PO CAPS
300.0000 mg | ORAL_CAPSULE | ORAL | Status: AC
  Administered 2019-05-15: 300 mg via ORAL
  Filled 2019-05-15: qty 1

## 2019-05-15 MED ORDER — GABAPENTIN 300 MG PO CAPS
300.0000 mg | ORAL_CAPSULE | Freq: Two times a day (BID) | ORAL | Status: DC
  Administered 2019-05-16 – 2019-05-17 (×3): 300 mg via ORAL
  Filled 2019-05-15 (×6): qty 1

## 2019-05-15 MED ORDER — SIMETHICONE 80 MG PO CHEW
80.0000 mg | CHEWABLE_TABLET | ORAL | Status: DC | PRN
  Administered 2019-05-16 – 2019-05-18 (×3): 80 mg via ORAL
  Filled 2019-05-15: qty 1

## 2019-05-15 SURGICAL SUPPLY — 30 items
BAG COUNTER SPONGE EZ (MISCELLANEOUS) ×2 IMPLANT
CANISTER SUCT 3000ML PPV (MISCELLANEOUS) ×3 IMPLANT
CHLORAPREP W/TINT 26 (MISCELLANEOUS) ×6 IMPLANT
COUNTER SPONGE BAG EZ (MISCELLANEOUS) ×1
COVER WAND RF STERILE (DRAPES) ×3 IMPLANT
DRAPE C SECTION CLR SCREEN (DRAPES) ×3 IMPLANT
DRSG TELFA 3X8 NADH (GAUZE/BANDAGES/DRESSINGS) ×3 IMPLANT
ELECT REM PT RETURN 9FT ADLT (ELECTROSURGICAL) ×3
ELECTRODE REM PT RTRN 9FT ADLT (ELECTROSURGICAL) ×1 IMPLANT
EXTRT SYSTEM ALEXIS 17CM (MISCELLANEOUS)
GAUZE SPONGE 4X4 12PLY STRL (GAUZE/BANDAGES/DRESSINGS) ×3 IMPLANT
GLOVE BIO SURGEON STRL SZ 6.5 (GLOVE) ×2 IMPLANT
GLOVE BIO SURGEONS STRL SZ 6.5 (GLOVE) ×1
GLOVE INDICATOR 7.0 STRL GRN (GLOVE) ×3 IMPLANT
GOWN STRL REUS W/ TWL LRG LVL3 (GOWN DISPOSABLE) ×2 IMPLANT
GOWN STRL REUS W/TWL LRG LVL3 (GOWN DISPOSABLE) ×4
KIT TURNOVER KIT A (KITS) ×3 IMPLANT
NS IRRIG 1000ML POUR BTL (IV SOLUTION) ×3 IMPLANT
PACK C SECTION AR (MISCELLANEOUS) ×3 IMPLANT
PAD OB MATERNITY 4.3X12.25 (PERSONAL CARE ITEMS) ×3 IMPLANT
PAD PREP 24X41 OB/GYN DISP (PERSONAL CARE ITEMS) ×3 IMPLANT
PENCIL SMOKE ULTRAEVAC 22 CON (MISCELLANEOUS) ×3 IMPLANT
RETRACTOR WND ALEXIS-O 25 LRG (MISCELLANEOUS) ×1 IMPLANT
RTRCTR WOUND ALEXIS O 25CM LRG (MISCELLANEOUS) ×3
SUT MNCRL AB 4-0 PS2 18 (SUTURE) ×3 IMPLANT
SUT PLAIN 2 0 XLH (SUTURE) IMPLANT
SUT VIC AB 0 CT1 36 (SUTURE) ×12 IMPLANT
SUT VIC AB 3-0 SH 27 (SUTURE) ×2
SUT VIC AB 3-0 SH 27X BRD (SUTURE) ×1 IMPLANT
SYSTEM CONTND EXTRCTN KII BLLN (MISCELLANEOUS) IMPLANT
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
## 2019-05-15 NOTE — Anesthesia Procedure Notes (Addendum)
Spinal  Patient location during procedure: OR End time: 05/15/2019 4:25 PM Staffing Anesthesiologist: Durenda Hurt, MD Resident/CRNA: Jonna Clark, CRNA Performed: anesthesiologist  Preanesthetic Checklist Completed: patient identified, site marked, surgical consent, pre-op evaluation, timeout performed, IV checked, risks and benefits discussed and monitors and equipment checked Spinal Block Patient position: sitting Prep: ChloraPrep Patient monitoring: heart rate, continuous pulse ox, blood pressure and cardiac monitor Approach: midline Location: L3-4 Injection technique: single-shot Needle Needle type: Whitacre and Introducer  Needle gauge: 24 G Needle length: 9 cm Assessment Sensory level: T4 Additional Notes Negative paresthesia. Negative blood return. Positive free-flowing CSF. Patient tolerated procedure well, without complications.

## 2019-05-15 NOTE — Transfer of Care (Signed)
Immediate Anesthesia Transfer of Care Note  Patient: Renee [name]  Procedure(s) Performed: REPEAT CESAREAN SECTION (N/A Abdomen)  Patient Location: PACU  Anesthesia Type:Spinal  Level of Consciousness: awake, alert  and oriented  Airway & Oxygen Therapy: Patient Spontanous Breathing  Post-op Assessment: Report given to RN and Post -op Vital signs reviewed and stable  Post vital signs: Reviewed and stable  Last Vitals:  Vitals Value Taken Time  BP 95/58 05/15/19 1747  Temp    Pulse    Resp 12 05/15/19 1747  SpO2 100 % 05/15/19 1747    Last Pain:  Vitals:   05/15/19 1545  TempSrc: Oral  PainSc:       Patients Stated Pain Goal: 0 (94/17/40 8144)  Complications: No apparent anesthesia complications

## 2019-05-15 NOTE — Anesthesia Post-op Follow-up Note (Signed)
Anesthesia QCDR form completed.        
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
## 2019-05-15 NOTE — Op Note (Addendum)
Cesarean Section with Escharotomy Procedure Note  Indications: prior Cesarean section x 2, desiring repeat  Pre-operative Diagnosis: 39 week 1 day pregnancy, history of Cesarean section x 2 desiring repeat, Subutex use in pregnancy, moderate anemia of pregnancy.  Post-operative Diagnosis: Same  Surgeon: Hildred LaserAnika Bluford Sedler, MD  Assistants:  Doreene BurkeAnnie Thompson, CNM. No other capable assistant available in surgery requiring high level assistant.  Procedure: Repeat low transverse Cesarean Section  Anesthesia: Spinal anesthesia  Procedure Details: The patient was seen in the Holding Room. The risks, benefits, complications, treatment options, and expected outcomes were discussed with the patient.  The patient concurred with the proposed plan, giving informed consent.  The site of surgery properly noted/marked. The patient was taken to the Operating Room, identified as Danella Sensingshley A Kafer and the procedure verified as C-Section Delivery. A Time Out was held and the above information confirmed.  After induction of anesthesia, the patient was prepped and draped in the usual sterile manner. Anesthesia was tested and noted to be adequate. A Pfannenstiel incision was made above and below the prior incision and the previous scar was excised using sharp dissection.  The incision was then carried down through the subcutaneous tissue to the fascia. Fascial incision was made and extended transversely. The fascia was separated from the underlying rectus tissue superiorly and inferiorly. Dense adhesions of the fascia to the underlying rectus muscles were noted. The peritoneum was identified and entered. Peritoneal incision was extended longitudinally. The surgical assist was able to provide retraction to allow for clear visualization of surgical site. The utero-vesical peritoneal reflection was incised transversely and the bladder flap was bluntly freed from the lower uterine segment. A low transverse uterine incision was made. A  Alexis retractor was placed into the abdominal cavity. Delivered from cephalic presentation was a 3060 gram Female with Apgar scores of 8 at one minute and 9 at five minutes.  The assistant was able to apply adequate fundal pressure to allow for successful delivery of the fetus. After the umbilical cord was clamped and cut, no cord blood was obtained for evaluation. The placenta was removed intact and appeared normal. The uterus was exteriorized and cleared of all clots and debris. The uterine outline, tubes and ovaries appeared normal.  The uterine incision was closed with a running locked suture of 0-Vicryl.  A second suture of 0-Vicryl was used to place a figure-of-eight suture at the right lateral edge of the uterine incision to achieve hemostasis.  Hemostasis was observed. Lavage was carried out until clear.  The peritoneal layer was closed with interrupted sutures of 2-0 Vicryl. The fascia was then reapproximated with a running suture of 0-Vicryl. The subcutaneous fat layer was reapproximated with 3-0 Vicryl. The skin was reapproximated with 4-0 Monocryl.  Steri-strips were placed over the incision.   Instrument, sponge, and needle counts were correct prior the abdominal closure and at the conclusion of the case.   Findings: Female infant, cephalic presentation, 3060 grams, with Apgar scores of 8 at one minute and 9 at five minutes. Dense adhesions of the fascia to the rectus muscle layers.  Intact placenta with 3 vessel cord.  Clear amniotic fluid at rupture.  The uterine outline, tubes and ovaries appeared normal.   Estimated Blood Loss:  300 ml      Drains: foley catheter to gravity drainage, 400 ml clear urine at end of the procedure  Total IV Fluids:  800 ml  Specimens: None          Implants: None  Complications:  None; patient tolerated the procedure well.         Disposition: PACU - hemodynamically stable.         Condition: stable   Rubie Maid, MD [facility]

## 2019-05-16 ENCOUNTER — Encounter: Payer: Self-pay | Admitting: Obstetrics and Gynecology

## 2019-05-16 DIAGNOSIS — O99013 Anemia complicating pregnancy, third trimester: Secondary | ICD-10-CM | POA: Diagnosis present

## 2019-05-16 LAB — CBC
HCT: 21.5 % — ABNORMAL LOW (ref 36.0–46.0)
Hemoglobin: 6.3 g/dL — ABNORMAL LOW (ref 12.0–15.0)
MCH: 20.8 pg — ABNORMAL LOW (ref 26.0–34.0)
MCHC: 29.3 g/dL — ABNORMAL LOW (ref 30.0–36.0)
MCV: 71 fL — ABNORMAL LOW (ref 80.0–100.0)
Platelets: 223 10*3/uL (ref 150–400)
RBC: 3.03 MIL/uL — ABNORMAL LOW (ref 3.87–5.11)
RDW: 15.9 % — ABNORMAL HIGH (ref 11.5–15.5)
WBC: 14.3 10*3/uL — ABNORMAL HIGH (ref 4.0–10.5)
nRBC: 0 % (ref 0.0–0.2)

## 2019-05-16 LAB — NOVEL CORONAVIRUS, NAA (HOSP ORDER, SEND-OUT TO REF LAB; TAT 18-24 HRS): SARS-CoV-2, NAA: NOT DETECTED

## 2019-05-16 LAB — RPR: RPR Ser Ql: NONREACTIVE

## 2019-05-16 MED ORDER — MORPHINE SULFATE (PF) 2 MG/ML IV SOLN: 2.0000 mg | INTRAVENOUS | Status: DC | PRN

## 2019-05-16 MED ORDER — OXYCODONE HCL 5 MG PO TABS
5.0000 mg | ORAL_TABLET | Freq: Four times a day (QID) | ORAL | Status: AC | PRN
  Administered 2019-05-16 (×4): 5 mg via ORAL
  Filled 2019-05-16 (×4): qty 1

## 2019-05-16 NOTE — Progress Notes (Addendum)
Postpartum Day # 1: Cesarean Delivery (repeat)  Subjective:  Patient reports tolerating PO and no problems voiding.  Has not yet passed flatus or had a BM. Has been up to nursery to see her baby. Ambulating in room. Does report incisional pain, mostly on the right and back pain.    Objective: Vital signs in last 24 hours: Temp:  [97.5 F (36.4 C)-98.6 F (37 C)] 98.6 F (37 C) (06/18 0700) Pulse Rate:  [47-105] 56 (06/18 0700) Resp:  [0-25] 18 (06/18 0700) BP: (92-137)/(50-93) 92/50 (06/18 0700) SpO2:  [94 %-100 %] 100 % (06/18 0326) Weight:  [79.8 kg] 79.8 kg (06/17 1141)  Physical Exam:  General: alert and no distress, patient sleeping comfortably upon my entrance into the rom Lungs: clear to auscultation bilaterally Breasts: normal appearance, no masses or tenderness Heart: regular rate and rhythm, S1, S2 normal, no murmur, click, rub or gallop Abdomen: soft, bowel sounds normal, mildly tympanic. Mildly tender at incision site Pelvis: Lochia appropriate, Uterine Fundus firm, Incision: bandage moderately soaked. Did not remove dressing. No active bleeding noted.  Extremities: DVT Evaluation: No evidence of DVT seen on physical exam. Negative Homan's sign. No cords or calf tenderness. No significant calf/ankle edema.  Recent Labs    05/15/19 1117 05/16/19 0428  HGB 8.2* 6.3*  HCT 27.7* 21.5*    Assessment/Plan: Status post Cesarean section. Doing well postoperatively.  Breastfeeding, Lactation consult as needed. Contraception undecided (considering Depo Provera) Regular diet as tolerated Continue PO pain management. Continue Subutex as prescribed.  Continue current care. Anemia of pregnancy worsened after surgery. Patient is asymptomatic, can be treated with PO or IV iron supplementation.  Plan for discharge tomorrow.  Rubie Maid, MD [facility]

## 2019-05-16 NOTE — Anesthesia Postprocedure Evaluation (Signed)
Anesthesia Post Note  Patient: Renee [name]  Procedure(s) Performed: REPEAT CESAREAN SECTION (N/A Abdomen)  Patient location during evaluation: Nursing Unit Anesthesia Type: Spinal Level of consciousness: awake, awake and alert and oriented Pain management: pain level controlled Vital Signs Assessment: post-procedure vital signs reviewed and stable Respiratory status: spontaneous breathing Cardiovascular status: blood pressure returned to baseline and stable Postop Assessment: no headache Anesthetic complications: no     Last Vitals:  Vitals:   05/16/19 0326 05/16/19 0700  BP: 111/70 (!) 92/50  Pulse:  (!) 56  Resp: 18 18  Temp: 36.6 C 37 C  SpO2: 100%     Last Pain:  Vitals:   05/16/19 0700  TempSrc: Oral  PainSc:                  Renee [name]

## 2019-05-17 MED ORDER — BUPRENORPHINE HCL 2 MG SL SUBL
8.0000 mg | SUBLINGUAL_TABLET | Freq: Two times a day (BID) | SUBLINGUAL | Status: DC
  Administered 2019-05-17 – 2019-05-18 (×2): 8 mg via SUBLINGUAL
  Filled 2019-05-17 (×2): qty 4

## 2019-05-17 MED ORDER — ACETAMINOPHEN 500 MG PO TABS
1000.0000 mg | ORAL_TABLET | Freq: Four times a day (QID) | ORAL | Status: DC | PRN
  Administered 2019-05-18 (×2): 1000 mg via ORAL
  Filled 2019-05-17 (×2): qty 2

## 2019-05-17 MED ORDER — BUPRENORPHINE HCL-NALOXONE HCL 8-2 MG SL SUBL
4.0000 | SUBLINGUAL_TABLET | Freq: Two times a day (BID) | SUBLINGUAL | Status: DC
  Filled 2019-05-17: qty 3
  Filled 2019-05-17: qty 1

## 2019-05-17 MED ORDER — IBUPROFEN 800 MG PO TABS
800.0000 mg | ORAL_TABLET | Freq: Three times a day (TID) | ORAL | Status: DC
  Administered 2019-05-17 – 2019-05-18 (×3): 800 mg via ORAL
  Filled 2019-05-17 (×3): qty 1

## 2019-05-17 MED ORDER — TRAMADOL HCL 50 MG PO TABS
100.0000 mg | ORAL_TABLET | Freq: Four times a day (QID) | ORAL | Status: DC | PRN
  Administered 2019-05-17 – 2019-05-18 (×4): 100 mg via ORAL
  Filled 2019-05-17 (×4): qty 2

## 2019-05-17 NOTE — Progress Notes (Signed)
Patient ID: Renee [name], female   DOB: 1991-06-01, 28 y.o.   MRN: [id number]  Post Partum Day # 2, s/p repeat cesarean section by Dr Marcelline Mates  Subjective:  Patient sitting in bed. No questions or concerns at this time. Went to see infant in nursery last night before bed, hasn't visited yet this morning.   Denies difficulty breathing or respiratory distress, chest pain, abdominal pain, excessive vaginal bleeding, dysuria, and leg pain or swelling.   Objective: Temp:  [98 F (36.7 C)-98.5 F (36.9 C)] 98.3 F (36.8 C) (06/19 0914) Pulse Rate:  [52-79] 79 (06/19 0914) Resp:  [14-18] 18 (06/19 0914) BP: (93-109)/(44-61) 109/58 (06/19 0914) SpO2:  [99 %-100 %] 100 % (06/19 0914)  Physical Exam:   General: alert and cooperative   Lungs: clear to auscultation bilaterally  Breasts: deferred, no complaints  Heart: normal apical impulse  Abdomen: mild tenderness at incision site; incision well approximated, steri strips intact  Pelvis: Lochia: appropriate, Uterine Fundus: firm  Extremities: DVT Evaluation: no evidence of DVT seen on physical exam.  Recent Labs    05/15/19 1117 05/16/19 0428  HGB 8.2* 6.3*  HCT 27.7* 21.5*    Assessment:  28 year old G3P3, Post Partum Day # 2, s/p repeat cesarean section, Rh positive, Postpartum anemia, Subutex use  Formula feeding-infant in SCN  Plan:  Routine postpartum care and education.   Reviewed red flag symptoms and when to call  Anticipated discharge in AM.    LOS: 2 days   Diona Fanti, CNM [facility], [facility], [place] 05/17/2019 9:28 AM

## 2019-05-17 NOTE — Care Management Important Message (Signed)
Important Message  Patient Details  Name: Renee [name] MRN: [id number] Date of Birth: [birth year]   Medicare Important Message Given:  Yes  Initial Medicare IM given by Patient Access Associate on 05/16/2019 at 0946.  Still valid.   Dannette Barbara 05/17/2019, 9:30 AM

## 2019-05-18 MED ORDER — TRAMADOL HCL 50 MG PO TABS: 100.0000 mg | ORAL_TABLET | Freq: Four times a day (QID) | ORAL | 0 refills | Status: DC | PRN

## 2019-05-18 MED ORDER — ACETAMINOPHEN 500 MG PO TABS: 1000.0000 mg | ORAL_TABLET | Freq: Four times a day (QID) | ORAL | 0 refills | Status: AC | PRN

## 2019-05-18 MED ORDER — SENNOSIDES-DOCUSATE SODIUM 8.6-50 MG PO TABS: 2.0000 | ORAL_TABLET | ORAL | 0 refills | Status: DC

## 2019-05-18 MED ORDER — GABAPENTIN 300 MG PO CAPS: 300.0000 mg | ORAL_CAPSULE | Freq: Two times a day (BID) | ORAL | 0 refills | Status: DC

## 2019-05-18 MED ORDER — IBUPROFEN 800 MG PO TABS: 800.0000 mg | ORAL_TABLET | Freq: Three times a day (TID) | ORAL | 0 refills | Status: DC

## 2019-05-18 MED ORDER — FERROUS SULFATE 325 (65 FE) MG PO TABS: 325.0000 mg | ORAL_TABLET | Freq: Two times a day (BID) | ORAL | 3 refills | Status: DC

## 2019-05-18 NOTE — Progress Notes (Signed)
Patient discharged from care, will room in with infant in room 331. Discharge instructions reviewed with pt, medications reviewed, pt states she has all of her prescriptions that she needs while here with infant. Follow up care reviewed, safe sleep with infant reviewed, all questions answered.

## 2019-05-18 NOTE — Discharge Summary (Signed)
Obstetric Discharge Summary  Patient ID: Renee [name] A Bouillon MRN: [id number] DOB/AGE: December 04, 1990 28 y.o.   Date of Admission: 05/15/2019  Date of Discharge:  05/18/19  Admitting Diagnosis: Scheduled cesarean section at 5676w1d  Secondary Diagnosis: Anemia in pregnancy, Subutex maintenance in pregnancy, History two (2) previous cesarean sections  Mode of Delivery: Repeat cesarean section- low uterine, transverse     Discharge Diagnosis: No other diagnosis and Reasons for cesarean section  Elective repeat   Intrapartum Procedures: Atificial rupture of membranes and Spinal   Post partum procedures: None  Complications: None   Brief Hospital Course    Renee [name] A Casas is a [id number] who underwent cesarean section on 05/15/2019.  Patient had an uncomplicated surgery; for further details, please refer to the operative note.  Patient had an uncomplicated postpartum course.  By time of discharge on PPD#3, her pain was controlled on oral pain medications; she had appropriate lochia and was ambulating, voiding without difficulty, tolerating regular diet and passing flatus.   She was deemed stable for discharge to home.    Labs: CBC Latest Ref Rng & Units 05/16/2019 05/15/2019 05/09/2019  WBC 4.0 - 10.5 K/uL 14.3(H) 12.1(H) 8.5  Hemoglobin 12.0 - 15.0 g/dL 6.3(L) 8.2(L) 7.6(L)  Hematocrit 36.0 - 46.0 % 21.5(L) 27.7(L) 25.2(L)  Platelets 150 - 400 K/uL 223 252 194   A POS Performed at [facility], [address] [place], [place] [address]   Physical exam:   Temp:  [97.5 F (36.4 C)-98.4 F (36.9 C)] 98.4 F (36.9 C) (06/20 0845) Pulse Rate:  [58-74] 60 (06/20 0845) Resp:  [18-20] 20 (06/20 0845) BP: (105-120)/(53-74) 120/74 (06/20 0845) SpO2:  [98 %-100 %] 100 % (06/20 0845)  General: alert and no distress  Lochia: appropriate  Abdomen: soft, NT  Uterine Fundus: firm  Incision: healing well, no significant drainage, no dehiscence, no significant erythema  Extremities: No  evidence of DVT seen on physical exam. No lower extremity edema.  Discharge Instructions: Per After Visit Summary.  Activity: Advance as tolerated. Pelvic rest for 6 weeks.  Also refer to After Visit  Summary  Diet: Regular  Medications: Allergies as of 05/18/2019      Reactions   Hydrocodone Itching   Morphine And Related Rash   Penicillins Rash   Has patient had a PCN reaction causing immediate rash, facial/tongue/throat swelling, SOB or lightheadedness with hypotension: Yes Has patient had a PCN reaction causing severe rash involving mucus membranes or skin necrosis: No Has patient had a PCN reaction that required hospitalization: No Has patient had a PCN reaction occurring within the last 10 years: No If all of the above answers are "NO", then may proceed with Cephalosporin use.      Medication List    STOP taking these medications   fluconazole 100 MG tablet Commonly known as: Diflucan   ondansetron 4 MG disintegrating tablet Commonly known as: ZOFRAN-ODT     TAKE these medications   acetaminophen 500 MG tablet Commonly known as: TYLENOL Take 2 tablets (1,000 mg total) by mouth every 6 (six) hours as needed for mild pain or moderate pain.   buprenorphine 8 MG Subl SL tablet Commonly known as: SUBUTEX Place 8 mg under the tongue 2 (two) times a day.   CitraNatal Bloom 90-1 MG Tabs Take 1 tablet by mouth daily.   ferrous sulfate 325 (65 FE) MG tablet Take 1 tablet (325 mg total) by mouth 2 (two) times daily with a meal.   gabapentin 300 MG  capsule Commonly known as: NEURONTIN Take 1 capsule (300 mg total) by mouth 2 (two) times daily.   ibuprofen 800 MG tablet Commonly known as: ADVIL Take 1 tablet (800 mg total) by mouth every 8 (eight) hours.   senna-docusate 8.6-50 MG tablet Commonly known as: Senokot-S Take 2 tablets by mouth daily. Start taking on: May 19, 2019   traMADol 50 MG tablet Commonly known as: ULTRAM Take 2 tablets (100 mg total) by  mouth every 6 (six) hours as needed for moderate pain or severe pain.            Discharge Care Instructions  (From admission, onward)         Start     Ordered   05/18/19 0000  Discharge wound care:    Comments: See AVS   05/18/19 1051         Outpatient follow up:  Follow-up Information    Philip Aspen, CNM. Call in 1 week(s).   Specialties: Certified Nurse Midwife, Radiology Why: Please schedule one (1) week incision check with [facility]T Contact information: [place] [address] [phone]        [facility]. Call in 6 week(s).   Why: Please schedule a six (6) week postpartum visit with midwife (AMT) Contact information: [place]  [place] [phone]         Postpartum contraception: abstinence, will discuss further during postpartum visit  Discharged Condition: stable  Discharged to: home   Newborn Data:  Disposition:SCN  Apgars: APGAR (1 MIN): 8   APGAR (5 MINS): 9    Baby Feeding: Bottle   Diona Fanti, CNM [facility], [facility] 05/18/19 10:53 AM

## 2019-05-31 MED ORDER — SULFAMETHOXAZOLE-TRIMETHOPRIM 800-160 MG PO TABS: 1.0000 | ORAL_TABLET | Freq: Two times a day (BID) | ORAL | 1 refills | Status: DC

## 2019-07-22 IMAGING — CR DG CHEST 2V
2 series · 2 of 2 positions shown · non-contrast
Comparison: 06/19/2017

CLINICAL DATA: Bilateral leg swelling

EXAM:
CHEST - 2 VIEW

[chest pa]
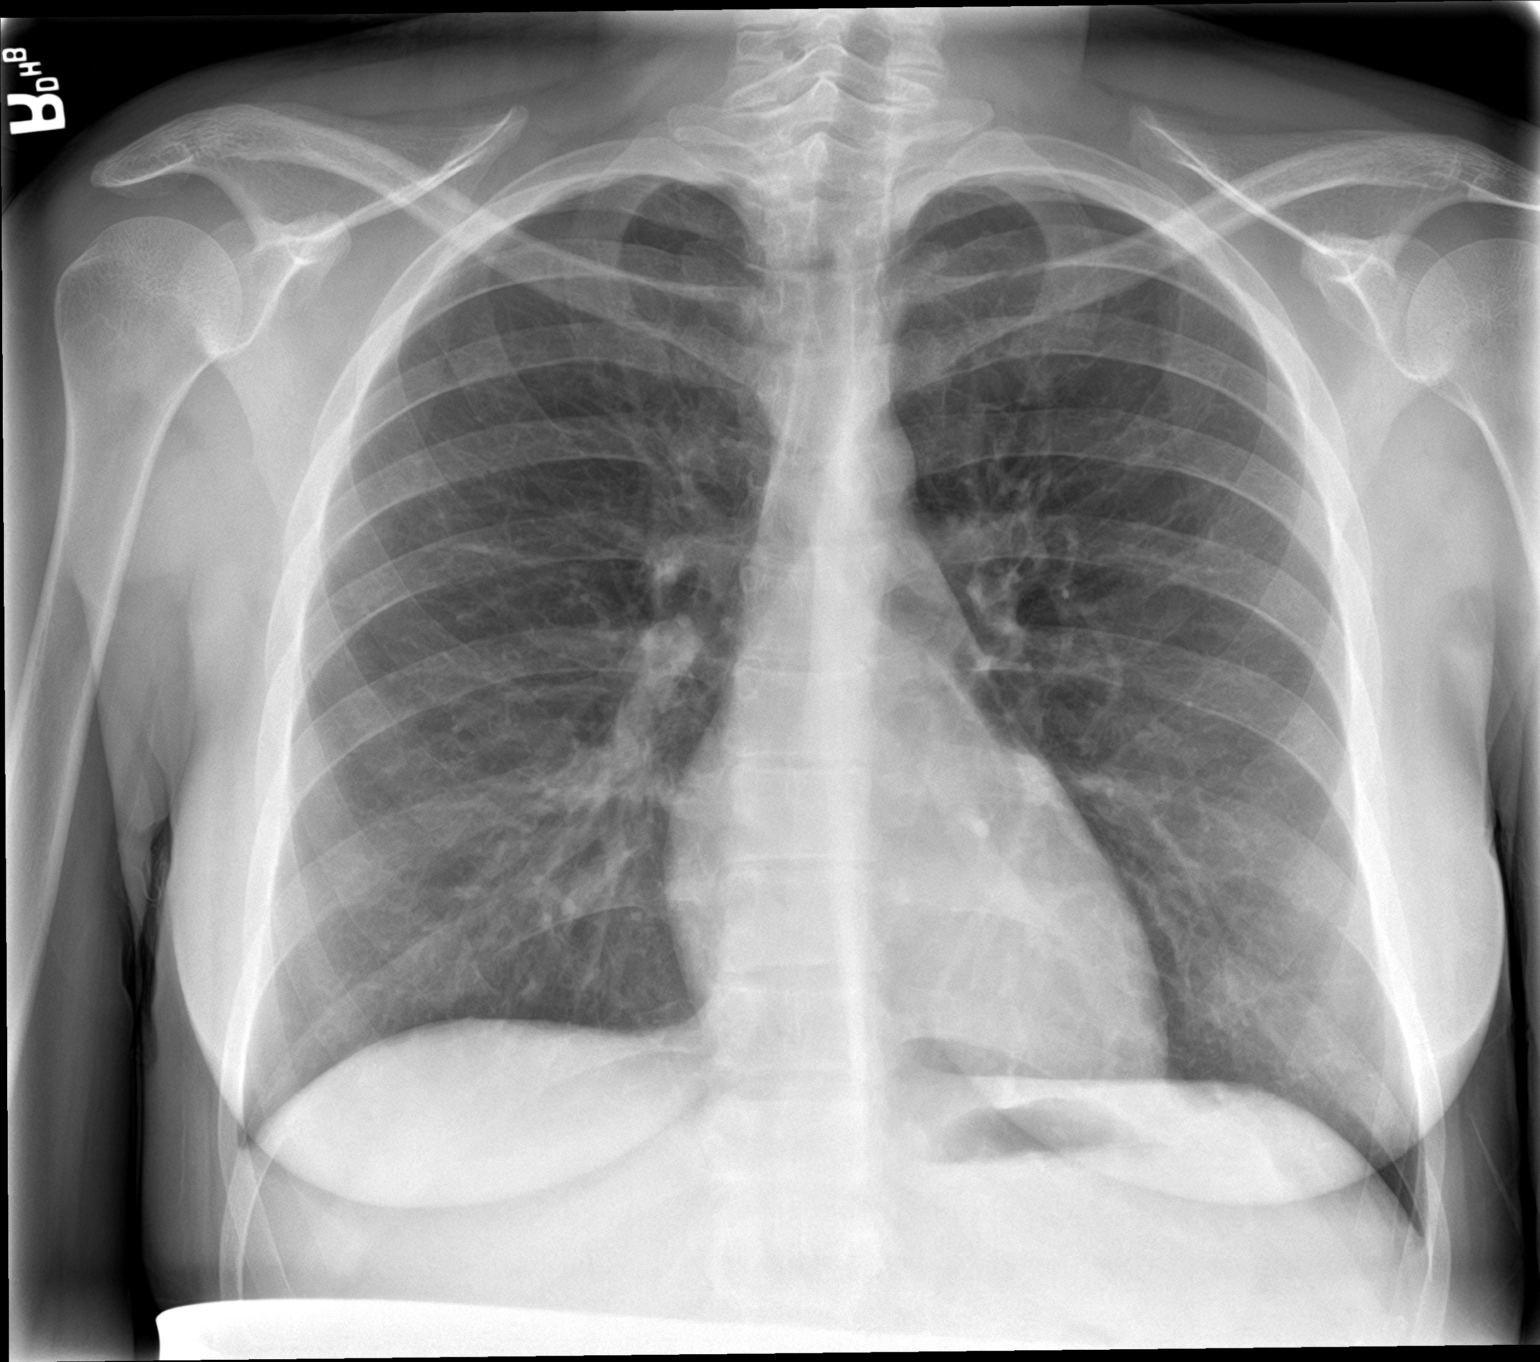

[chest lat]
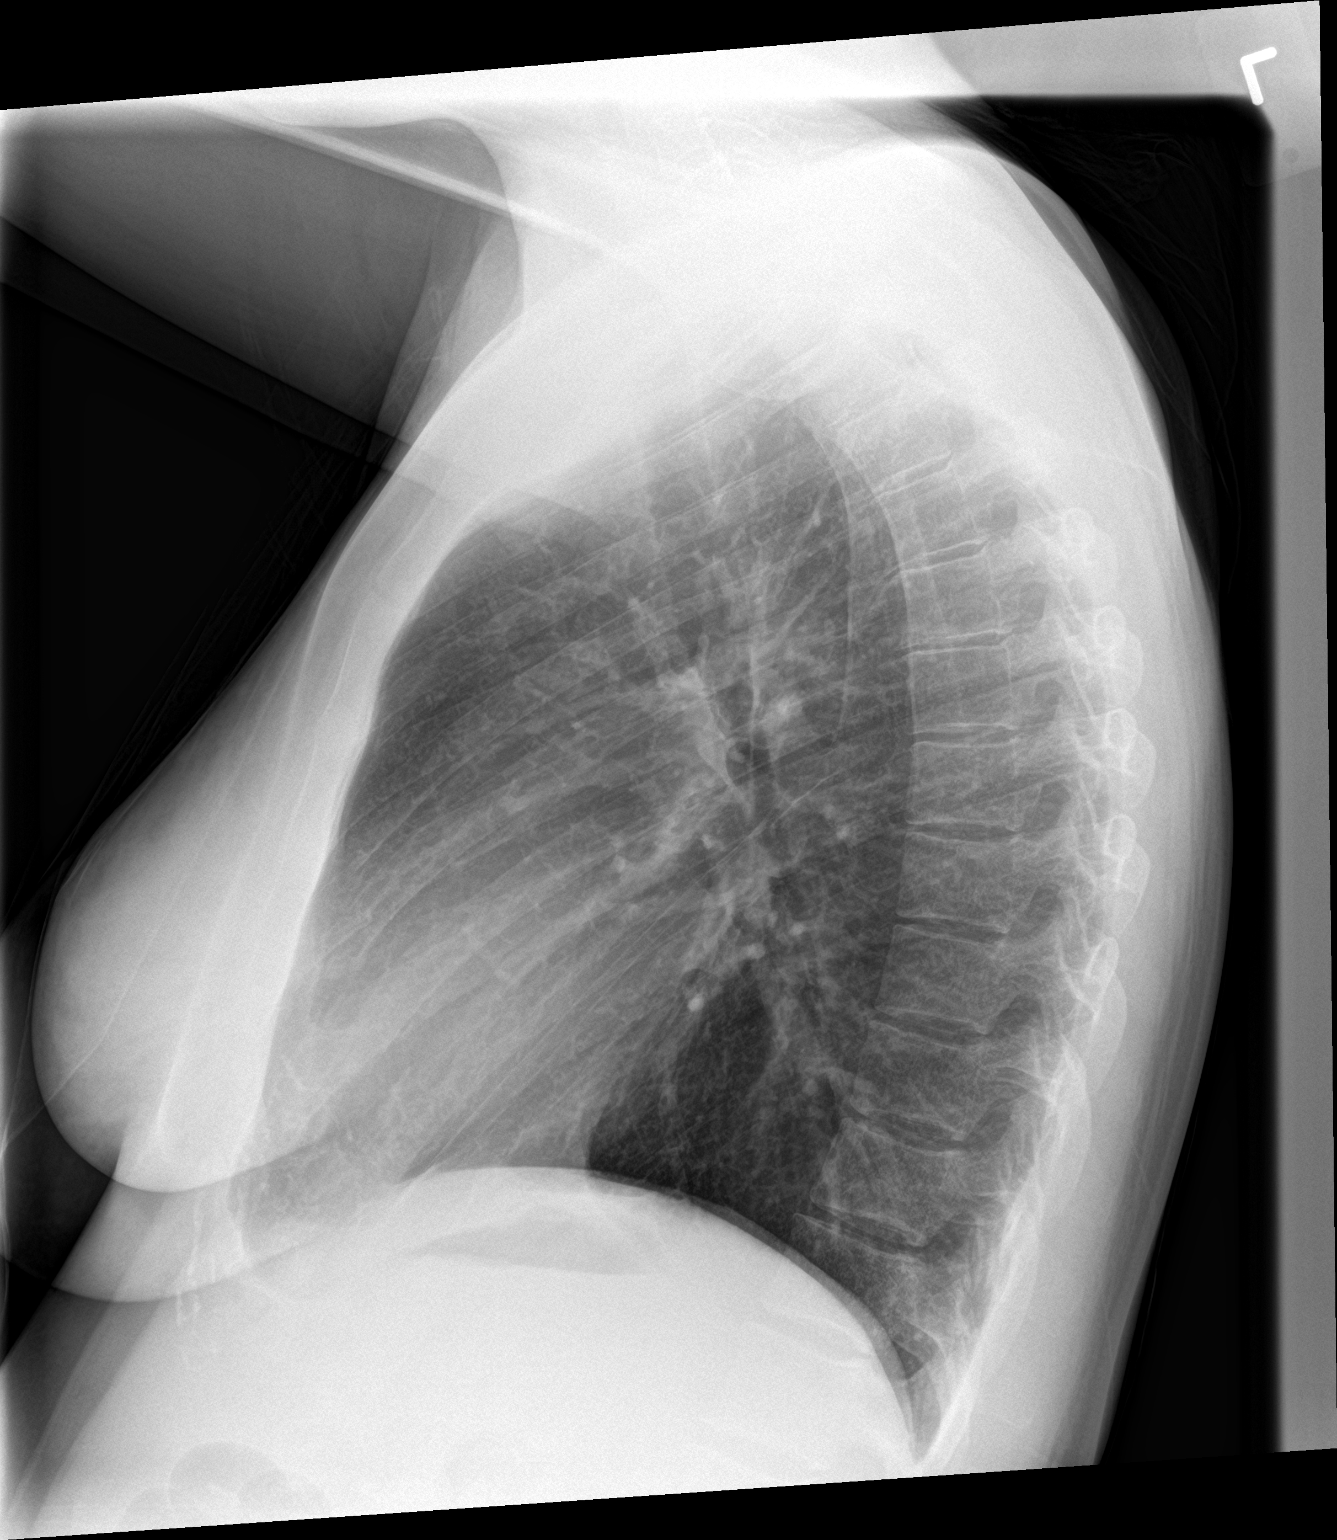

[2 of 2 positions shown; findings below may reference images not displayed]

FINDINGS: The heart size and mediastinal contours are within normal limits.
Both lungs are clear. The visualized skeletal structures are
unremarkable.
IMPRESSION: No active cardiopulmonary disease.

## 2019-12-11 ENCOUNTER — Other Ambulatory Visit: Payer: Self-pay

## 2019-12-11 DIAGNOSIS — O99891 Other specified diseases and conditions complicating pregnancy: Secondary | ICD-10-CM | POA: Diagnosis present

## 2019-12-11 DIAGNOSIS — R1030 Lower abdominal pain, unspecified: Secondary | ICD-10-CM | POA: Insufficient documentation

## 2019-12-11 DIAGNOSIS — Z3A00 Weeks of gestation of pregnancy not specified: Secondary | ICD-10-CM | POA: Insufficient documentation

## 2019-12-11 DIAGNOSIS — Z5321 Procedure and treatment not carried out due to patient leaving prior to being seen by health care provider: Secondary | ICD-10-CM | POA: Diagnosis not present

## 2019-12-11 LAB — CBC
HCT: 25.4 % — ABNORMAL LOW (ref 36.0–46.0)
Hemoglobin: 7.5 g/dL — ABNORMAL LOW (ref 12.0–15.0)
MCH: 20.5 pg — ABNORMAL LOW (ref 26.0–34.0)
MCHC: 29.5 g/dL — ABNORMAL LOW (ref 30.0–36.0)
MCV: 69.6 fL — ABNORMAL LOW (ref 80.0–100.0)
Platelets: 247 10*3/uL (ref 150–400)
RBC: 3.65 MIL/uL — ABNORMAL LOW (ref 3.87–5.11)
RDW: 15.3 % (ref 11.5–15.5)
WBC: 5.3 10*3/uL (ref 4.0–10.5)
nRBC: 0 % (ref 0.0–0.2)

## 2019-12-11 LAB — COMPREHENSIVE METABOLIC PANEL
ALT: 11 U/L (ref 0–44)
AST: 12 U/L — ABNORMAL LOW (ref 15–41)
Albumin: 4 g/dL (ref 3.5–5.0)
Alkaline Phosphatase: 49 U/L (ref 38–126)
Anion gap: 7 (ref 5–15)
BUN: 17 mg/dL (ref 6–20)
CO2: 26 mmol/L (ref 22–32)
Calcium: 8.9 mg/dL (ref 8.9–10.3)
Chloride: 104 mmol/L (ref 98–111)
Creatinine, Ser: 0.64 mg/dL (ref 0.44–1.00)
GFR calc Af Amer: >60 mL/min (ref >60)
GFR calc non Af Amer: >60 mL/min (ref >60)
Glucose, Bld: 112 mg/dL — ABNORMAL HIGH (ref 70–99)
Potassium: 4.1 mmol/L (ref 3.5–5.1)
Sodium: 137 mmol/L (ref 135–145)
Total Bilirubin: 0.4 mg/dL (ref 0.3–1.2)
Total Protein: 6.5 g/dL (ref 6.5–8.1)

## 2019-12-11 LAB — LIPASE, BLOOD: Lipase: 20 U/L (ref 11–51)

## 2019-12-11 LAB — URINALYSIS, COMPLETE (UACMP) WITH MICROSCOPIC
Bacteria, UA: NONE SEEN
Bilirubin Urine: NEGATIVE
Glucose, UA: NEGATIVE mg/dL
Hgb urine dipstick: NEGATIVE
Ketones, ur: NEGATIVE mg/dL
Leukocytes,Ua: NEGATIVE
Nitrite: NEGATIVE
Protein, ur: 30 mg/dL — AB
Specific Gravity, Urine: 1.026 (ref 1.005–1.030)
pH: 6 (ref 5.0–8.0)

## 2019-12-11 LAB — POCT PREGNANCY, URINE: Preg Test, Ur: POSITIVE — AB

## 2019-12-11 NOTE — ED Triage Notes (Signed)
Pt presents via POV c/o lower abd cramping. Pt reports positive pregnancy test x2 weeks ago. Denies vaginal bleeding. Reports nauseated.

## 2019-12-12 ENCOUNTER — Emergency Department
Admission: EM | Admit: 2019-12-12 | Discharge: 2019-12-12 | Disposition: A | Discharge status: Home / Self Care | Payer: Medicare Other | Attending: Emergency Medicine | Admitting: Emergency Medicine

## 2019-12-12 DIAGNOSIS — R102 Pelvic and perineal pain: Secondary | ICD-10-CM

## 2019-12-12 LAB — HCG, QUANTITATIVE, PREGNANCY: hCG, Beta Chain, Quant, S: 46412 m[IU]/mL — ABNORMAL HIGH (ref <5)

## 2019-12-12 NOTE — ED Notes (Signed)
Patient called, no answer.

## 2019-12-12 NOTE — ED Notes (Signed)
Patient called, no answer. Patient not seen in lobby, bathroom, or outside.  

## 2019-12-12 NOTE — ED Notes (Signed)
Patient not seen in lobby.

## 2020-01-01 ENCOUNTER — Emergency Department
Admission: EM | Admit: 2020-01-01 | Discharge: 2020-01-01 | Disposition: A | Discharge status: Home / Self Care | Payer: Medicare Other | Attending: Emergency Medicine | Admitting: Emergency Medicine

## 2020-01-01 ENCOUNTER — Encounter: Payer: Self-pay | Admitting: Emergency Medicine

## 2020-01-01 DIAGNOSIS — R519 Headache, unspecified: Secondary | ICD-10-CM | POA: Diagnosis present

## 2020-01-01 DIAGNOSIS — R5383 Other fatigue: Secondary | ICD-10-CM | POA: Diagnosis not present

## 2020-01-01 DIAGNOSIS — Z1152 Encounter for screening for COVID-19: Secondary | ICD-10-CM

## 2020-01-01 DIAGNOSIS — R0981 Nasal congestion: Secondary | ICD-10-CM | POA: Diagnosis not present

## 2020-01-01 DIAGNOSIS — Z20822 Contact with and (suspected) exposure to covid-19: Secondary | ICD-10-CM | POA: Diagnosis not present

## 2020-01-01 DIAGNOSIS — Z79899 Other long term (current) drug therapy: Secondary | ICD-10-CM | POA: Insufficient documentation

## 2020-01-01 NOTE — ED Triage Notes (Signed)
Pt reports she was exposed to COVID x1 week ago and since has had intermittent HA and general fatigue. Pt denies fever and SOB.
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
## 2020-01-01 NOTE — ED Notes (Signed)
Pt states she is here "for a COVID test".  C/o headache and fatigue x2 weeks.  States she was around her manager one week ago who tested positive for COVID.  Pt in NAD at this time.

## 2020-01-02 LAB — SARS CORONAVIRUS 2 (TAT 6-24 HRS): SARS Coronavirus 2: NEGATIVE

## 2020-01-13 ENCOUNTER — Emergency Department: Payer: Medicare Other

## 2020-01-13 ENCOUNTER — Other Ambulatory Visit: Payer: Self-pay

## 2020-01-13 ENCOUNTER — Encounter: Payer: Self-pay | Admitting: *Deleted

## 2020-01-13 DIAGNOSIS — Z5321 Procedure and treatment not carried out due to patient leaving prior to being seen by health care provider: Secondary | ICD-10-CM | POA: Diagnosis not present

## 2020-01-13 DIAGNOSIS — Z3A20 20 weeks gestation of pregnancy: Secondary | ICD-10-CM | POA: Insufficient documentation

## 2020-01-13 DIAGNOSIS — O99891 Other specified diseases and conditions complicating pregnancy: Secondary | ICD-10-CM | POA: Insufficient documentation

## 2020-01-13 DIAGNOSIS — N899 Noninflammatory disorder of vagina, unspecified: Secondary | ICD-10-CM | POA: Insufficient documentation

## 2020-01-13 LAB — COMPREHENSIVE METABOLIC PANEL
ALT: 10 U/L (ref 0–44)
AST: 12 U/L — ABNORMAL LOW (ref 15–41)
Albumin: 3.7 g/dL (ref 3.5–5.0)
Alkaline Phosphatase: 39 U/L (ref 38–126)
Anion gap: 8 (ref 5–15)
BUN: 13 mg/dL (ref 6–20)
CO2: 23 mmol/L (ref 22–32)
Calcium: 8.8 mg/dL — ABNORMAL LOW (ref 8.9–10.3)
Chloride: 105 mmol/L (ref 98–111)
Creatinine, Ser: 0.42 mg/dL — ABNORMAL LOW (ref 0.44–1.00)
GFR calc Af Amer: >60 mL/min (ref >60)
GFR calc non Af Amer: >60 mL/min (ref >60)
Glucose, Bld: 96 mg/dL (ref 70–99)
Potassium: 3.9 mmol/L (ref 3.5–5.1)
Sodium: 136 mmol/L (ref 135–145)
Total Bilirubin: 0.5 mg/dL (ref 0.3–1.2)
Total Protein: 6.2 g/dL — ABNORMAL LOW (ref 6.5–8.1)

## 2020-01-13 LAB — CBC
HCT: 25.2 % — ABNORMAL LOW (ref 36.0–46.0)
Hemoglobin: 7.3 g/dL — ABNORMAL LOW (ref 12.0–15.0)
MCH: 19.8 pg — ABNORMAL LOW (ref 26.0–34.0)
MCHC: 29 g/dL — ABNORMAL LOW (ref 30.0–36.0)
MCV: 68.3 fL — ABNORMAL LOW (ref 80.0–100.0)
Platelets: 247 10*3/uL (ref 150–400)
RBC: 3.69 MIL/uL — ABNORMAL LOW (ref 3.87–5.11)
RDW: 15.7 % — ABNORMAL HIGH (ref 11.5–15.5)
WBC: 5.8 10*3/uL (ref 4.0–10.5)
nRBC: 0 % (ref 0.0–0.2)

## 2020-01-13 LAB — URINALYSIS, COMPLETE (UACMP) WITH MICROSCOPIC
Bacteria, UA: NONE SEEN
Bilirubin Urine: NEGATIVE
Glucose, UA: NEGATIVE mg/dL
Hgb urine dipstick: NEGATIVE
Ketones, ur: NEGATIVE mg/dL
Nitrite: NEGATIVE
Protein, ur: NEGATIVE mg/dL
Specific Gravity, Urine: 1.024 (ref 1.005–1.030)
pH: 7 (ref 5.0–8.0)

## 2020-01-13 LAB — LIPASE, BLOOD: Lipase: 15 U/L (ref 11–51)

## 2020-01-13 LAB — HCG, QUANTITATIVE, PREGNANCY: hCG, Beta Chain, Quant, S: 166265 m[IU]/mL — ABNORMAL HIGH (ref <5)

## 2020-01-13 NOTE — ED Notes (Signed)
Pt is A+ as listed under results

## 2020-01-13 NOTE — ED Triage Notes (Signed)
PT to ED reporting lower abd pain that started yesterday. No vaginal bleeding but pt reports a "cream like" vaginal discharge. Pt reporting nausea but states she has been nauseous ever since finding out she was pregnant in January (pt unsure how far along she is). Continuous headaches also reported. No neurologic deficits noted.

## 2020-01-14 ENCOUNTER — Telehealth: Payer: Self-pay | Admitting: Emergency Medicine

## 2020-01-14 ENCOUNTER — Emergency Department
Admission: EM | Admit: 2020-01-14 | Discharge: 2020-01-14 | Disposition: A | Discharge status: Home / Self Care | Payer: Medicare Other | Attending: Emergency Medicine | Admitting: Emergency Medicine

## 2020-01-14 DIAGNOSIS — R1030 Lower abdominal pain, unspecified: Secondary | ICD-10-CM

## 2020-01-14 NOTE — ED Notes (Signed)
No answer when called several times from lobby 

## 2020-01-14 NOTE — Telephone Encounter (Signed)
Called patient due to lwot to inquire about condition and follow up plans. She is doing prenatal care at [facility].  I asked her to call them and tell them her symptoms and that lab and ultrasound results are available for review.  She agrees.

## 2020-03-02 ENCOUNTER — Encounter: Payer: Medicare Other | Admitting: Certified Nurse Midwife

## 2020-03-26 ENCOUNTER — Encounter: Payer: Self-pay | Admitting: Obstetrics and Gynecology

## 2020-03-26 ENCOUNTER — Observation Stay
Admission: EM | Admit: 2020-03-26 | Discharge: 2020-03-26 | Disposition: A | Discharge status: Home / Self Care | Payer: Medicare Other | Attending: Obstetrics and Gynecology | Admitting: Obstetrics and Gynecology

## 2020-03-26 ENCOUNTER — Other Ambulatory Visit: Payer: Self-pay

## 2020-03-26 DIAGNOSIS — Z3490 Encounter for supervision of normal pregnancy, unspecified, unspecified trimester: Secondary | ICD-10-CM

## 2020-03-26 DIAGNOSIS — R1084 Generalized abdominal pain: Secondary | ICD-10-CM

## 2020-03-26 DIAGNOSIS — Z3A22 22 weeks gestation of pregnancy: Secondary | ICD-10-CM | POA: Insufficient documentation

## 2020-03-26 DIAGNOSIS — O26892 Other specified pregnancy related conditions, second trimester: Secondary | ICD-10-CM | POA: Diagnosis not present

## 2020-03-26 DIAGNOSIS — O0932 Supervision of pregnancy with insufficient antenatal care, second trimester: Secondary | ICD-10-CM | POA: Diagnosis not present

## 2020-03-26 DIAGNOSIS — R102 Pelvic and perineal pain: Secondary | ICD-10-CM | POA: Insufficient documentation

## 2020-03-26 DIAGNOSIS — R1031 Right lower quadrant pain: Secondary | ICD-10-CM | POA: Diagnosis present

## 2020-03-26 DIAGNOSIS — Z3A20 20 weeks gestation of pregnancy: Secondary | ICD-10-CM

## 2020-03-26 LAB — URINALYSIS, ROUTINE W REFLEX MICROSCOPIC
Bilirubin Urine: NEGATIVE
Glucose, UA: NEGATIVE mg/dL
Ketones, ur: NEGATIVE mg/dL
Leukocytes,Ua: NEGATIVE
Nitrite: NEGATIVE
Protein, ur: 30 mg/dL — AB
Specific Gravity, Urine: 1.023 (ref 1.005–1.030)
pH: 5 (ref 5.0–8.0)

## 2020-03-26 MED ORDER — ACETAMINOPHEN 325 MG PO TABS
650.0000 mg | ORAL_TABLET | Freq: Once | ORAL | Status: AC
  Administered 2020-03-26: 650 mg via ORAL

## 2020-03-26 MED ORDER — ACETAMINOPHEN 325 MG PO TABS
ORAL_TABLET | ORAL | Status: AC
  Filled 2020-03-26: qty 2

## 2020-03-26 NOTE — Discharge Instructions (Signed)
Call [facility] tomorrow to schedule an appointment with Dr Logan Bores for next week [phone]  A culture was sent on your urine and results should be back in approx 48 hours. If additional treatment is needed you will receive a phone call

## 2020-03-26 NOTE — OB Triage Note (Signed)
Pt arrives with c/o back pain specifically left lower flank pain. Pt is G4P3 with hx C/S x 3. Pt is between 19-22 weeks. States pain has been ongoing x 2 weeks with constant pain increasing with movement. Radiating to head and neck.  Pt states she has had no care besides visit to ED in Feb when they did an [place] and gave her an EDC of 9/4.

## 2020-03-31 NOTE — Discharge Summary (Signed)
    L&D OB Triage Note  SUBJECTIVE Renee [name] is a 29 y.o. [phone] female at [redacted]w[redacted]d, EDD Estimated Date of Delivery: 08/01/20 who presented to triage with complaints of right and left lower quadrant pain with exertion.  She reports that by an ED ultrasound she is approximately [redacted] weeks pregnant.  She has received no prenatal care during this pregnancy.   OB History  Gravida Para Term Preterm AB Living  4 3 3  0 0 3  SAB TAB Ectopic Multiple Live Births  0 0 0 0 3    # Outcome Date GA Lbr Len/2nd Weight Sex Delivery Anes PTL Lv  4 Current           3 Term 05/15/19 [redacted]w[redacted]d  3060 g F CS-LTranv Spinal  LIV     Name: Borchardt,GIRL Eliabeth     Apgar1: 8  Apgar5: 9  2 Term 2012    F CS-LTranv   LIV  1 Term 2011    M CS-LTranv   LIV    No medications prior to admission.     OBJECTIVE  Nursing Evaluation:   BP (!) 102/50 (BP Location: Left Arm)   Pulse 97   Temp 97.9 F (36.6 C) (Oral)   Resp 16   Ht 5\' 4"  (1.626 m)   Wt 59.9 kg   LMP 11/12/2019   BMI 22.66 kg/m    Findings:        Urinalysis reveals bacteria but otherwise negative.     Pain resolved with hydration and rest     No evidence of contractions     Fetal heart tones appropriate for gestational age      NST was performed and has been reviewed by me.  NST INTERPRETATION: Category I  Mode: Doppler Baseline Rate (A): 145 bpm              ASSESSMENT Impression:  1.  Pregnancy:  at [redacted]w[redacted]d , EDD Estimated Date of Delivery: 08/01/20 no prenatal care 2.  Reassuring fetal and maternal status 3.  Round ligament pain  PLAN 1. Discussed current condition and above findings with patient and reassurance given.  All questions answered. 2. Discharge home with standard labor precautions given to return to L&D or call the office for problems. 3.  Patient recommended to begin routine prenatal care.

## 2020-04-03 ENCOUNTER — Ambulatory Visit (INDEPENDENT_AMBULATORY_CARE_PROVIDER_SITE_OTHER): Payer: Medicare Other | Admitting: Certified Nurse Midwife

## 2020-04-03 ENCOUNTER — Other Ambulatory Visit: Payer: Self-pay

## 2020-04-03 ENCOUNTER — Encounter: Payer: Medicare Other | Admitting: Obstetrics and Gynecology

## 2020-04-03 ENCOUNTER — Encounter: Payer: Self-pay | Admitting: Certified Nurse Midwife

## 2020-04-03 VITALS — BP 97/51 | HR 70 | Wt 156.2 lb

## 2020-04-03 DIAGNOSIS — O0930 Supervision of pregnancy with insufficient antenatal care, unspecified trimester: Secondary | ICD-10-CM

## 2020-04-03 DIAGNOSIS — Z113 Encounter for screening for infections with a predominantly sexual mode of transmission: Secondary | ICD-10-CM

## 2020-04-03 DIAGNOSIS — O99612 Diseases of the digestive system complicating pregnancy, second trimester: Secondary | ICD-10-CM

## 2020-04-03 DIAGNOSIS — K5900 Constipation, unspecified: Secondary | ICD-10-CM

## 2020-04-03 DIAGNOSIS — Z98891 History of uterine scar from previous surgery: Secondary | ICD-10-CM

## 2020-04-03 DIAGNOSIS — Z3A22 22 weeks gestation of pregnancy: Secondary | ICD-10-CM

## 2020-04-03 DIAGNOSIS — Z130 Encounter for screening for diseases of the blood and blood-forming organs and certain disorders involving the immune mechanism: Secondary | ICD-10-CM

## 2020-04-03 DIAGNOSIS — Z6710 Type A blood, Rh positive: Secondary | ICD-10-CM

## 2020-04-03 DIAGNOSIS — O99320 Drug use complicating pregnancy, unspecified trimester: Secondary | ICD-10-CM

## 2020-04-03 DIAGNOSIS — Z114 Encounter for screening for human immunodeficiency virus [HIV]: Secondary | ICD-10-CM

## 2020-04-03 DIAGNOSIS — Z3492 Encounter for supervision of normal pregnancy, unspecified, second trimester: Secondary | ICD-10-CM

## 2020-04-03 DIAGNOSIS — F1120 Opioid dependence, uncomplicated: Secondary | ICD-10-CM

## 2020-04-03 DIAGNOSIS — R82998 Other abnormal findings in urine: Secondary | ICD-10-CM

## 2020-04-03 LAB — POCT URINALYSIS DIPSTICK OB
Bilirubin, UA: NEGATIVE
Blood, UA: NEGATIVE
Glucose, UA: NEGATIVE
Ketones, UA: NEGATIVE
Leukocytes, UA: NEGATIVE
Nitrite, UA: NEGATIVE
POC,PROTEIN,UA: NEGATIVE
Spec Grav, UA: 1.01 (ref 1.010–1.025)
Urobilinogen, UA: 0.2 E.U./dL
pH, UA: 5 (ref 5.0–8.0)

## 2020-04-03 MED ORDER — POLYETHYLENE GLYCOL 3350 17 GM/SCOOP PO POWD: 1.0000 | Freq: Once | ORAL | 0 refills | Status: AC

## 2020-04-03 MED ORDER — DOCUSATE SODIUM 100 MG PO CAPS: 100.0000 mg | ORAL_CAPSULE | Freq: Two times a day (BID) | ORAL | 2 refills | Status: DC | PRN
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
## 2020-04-03 NOTE — Patient Instructions (Signed)
Cesarean Delivery Cesarean birth, or cesarean delivery, is the surgical delivery of a baby through an incision in the abdomen and the uterus. This may be referred to as a C-section. This procedure may be scheduled ahead of time, or it may be done in an emergency situation. Tell a health care provider about:  Any allergies you have.  All medicines you are taking, including vitamins, herbs, eye drops, creams, and over-the-counter medicines.  Any problems you or family members have had with anesthetic medicines.  Any blood disorders you have.  Any surgeries you have had.  Any medical conditions you have.  Whether you or any members of your family have a history of deep vein thrombosis (DVT) or pulmonary embolism (PE). What are the risks? Generally, this is a safe procedure. However, problems may occur, including:  Infection.  Bleeding.  Allergic reactions to medicines.  Damage to other structures or organs.  Blood clots.  Injury to your baby. What happens before the procedure? General instructions  Follow instructions from your health care provider about eating or drinking restrictions.  If you know that you are going to have a cesarean delivery, do not shave your pubic area. Shaving before the procedure may increase your risk of infection.  Plan to have someone take you home from the hospital.  Ask your health care provider what steps will be taken to prevent infection. These may include: ? Removing hair at the surgery site. ? Washing skin with a germ-killing soap. ? Taking antibiotic medicine.  Depending on the reason for your cesarean delivery, you may have a physical exam or additional testing, such as an ultrasound.  You may have your blood or urine tested. Questions for your health care provider  Ask your health care provider about: ? Changing or stopping your regular medicines. This is especially important if you are taking diabetes medicines or blood  thinners. ? Your pain management plan. This is especially important if you plan to breastfeed your baby. ? How long you will be in the hospital after the procedure. ? Any concerns you may have about receiving blood products, if you need them during the procedure. ? Cord blood banking, if you plan to collect your baby's umbilical cord blood.  You may also want to ask your health care provider: ? Whether you will be able to hold or breastfeed your baby while you are still in the operating room. ? Whether your baby can stay with you immediately after the procedure and during your recovery. ? Whether a family member or a person of your choice can go with you into the operating room and stay with you during the procedure, immediately after the procedure, and during your recovery. What happens during the procedure?   An IV will be inserted into one of your veins.  Fluid and medicines, such as antibiotics, will be given before the surgery.  Fetal monitors will be placed on your abdomen to check your baby's heart rate.  You may be given a special warming gown to wear to keep your temperature stable.  A catheter may be inserted into your bladder through your urethra. This drains your urine during the procedure.  You may be given one or more of the following: ? A medicine to numb the area (local anesthetic). ? A medicine to make you fall asleep (general anesthetic). ? A medicine (regional anesthetic) that is injected into your back or through a small thin tube placed in your back (spinal anesthetic or epidural anesthetic).   This numbs everything below the injection site and allows you to stay awake during your procedure. If this makes you feel nauseous, tell your health care provider. Medicines will be available to help reduce any nausea you may feel.  An incision will be made in your abdomen, and then in your uterus.  If you are awake during your procedure, you may feel tugging and pulling in  your abdomen, but you should not feel pain. If you feel pain, tell your health care provider immediately.  Your baby will be removed from your uterus. You may feel more pressure or pushing while this happens.  Immediately after birth, your baby will be dried and kept warm. You may be able to hold and breastfeed your baby.  The umbilical cord may be clamped and cut during this time. This usually occurs after waiting a period of 1-2 minutes after delivery.  Your placenta will be removed from your uterus.  Your incisions will be closed with stitches (sutures). Staples, skin glue, or adhesive strips may also be applied to the incision in your abdomen.  Bandages (dressings) may be placed over the incision in your abdomen. The procedure may vary among health care providers and hospitals. What happens after the procedure?  Your blood pressure, heart rate, breathing rate, and blood oxygen level will be monitored until you are discharged from the hospital.  You may continue to receive fluids and medicines through an IV.  You will have some pain. Medicines will be available to help control your pain.  To help prevent blood clots: ? You may be given medicines. ? You may have to wear compression stockings or devices. ? You will be encouraged to walk around when you are able.  Hospital staff will encourage and support bonding with your baby. Your hospital may have you and your baby to stay in the same room (rooming in) during your hospital stay to encourage successful bonding and breastfeeding.  You may be encouraged to cough and breathe deeply often. This helps to prevent lung problems.  If you have a catheter draining your urine, it will be removed as soon as possible after your procedure. Summary  Cesarean birth, or cesarean delivery, is the surgical delivery of a baby through an incision in the abdomen and the uterus.  Follow instructions from your health care provider about eating or  drinking restrictions before the procedure.  You will have some pain after the procedure. Medicines will be available to help control your pain.  Hospital staff will encourage and support bonding with your baby after the procedure. Your hospital may have you and your baby to stay in the same room (rooming in) during your hospital stay to encourage successful bonding and breastfeeding. This information is not intended to replace advice given to you by your health care provider. Make sure you discuss any questions you have with your health care provider. Document Revised: 05/21/2018 Document Reviewed: 05/21/2018 Elsevier Patient Education  [place] Weight Gain During Pregnancy, Adult A certain amount of weight gain during pregnancy is normal and healthy. How much weight you should gain depends on your overall health and a measurement called BMI (body mass index). BMI is an estimate of your body fat based on your height and weight. You can use an Freight forwarder to figure out your BMI, or you can ask your health care provider to calculate it for you at your next visit. Your recommended pregnancy weight gain is based on your  pre-pregnancy BMI. General guidelines for a healthy total weight gain during pregnancy are listed below. If your BMI at or before the start of your pregnancy is:  Less than 18.5 (underweight), you should gain 28-40 lb (13-18 kg).  18.5-24.9 (normal weight), you should gain 25-35 lb (11-16 kg).  25-29.9 (overweight), you should gain 15-25 lb (7-11 kg).  30 or higher (obese), you should gain 11-20 lb (5-9 kg). These ranges vary depending on your individual health. If you are carrying more than one baby (multiples), it may be safe to gain more weight than these recommendations. If you gain less weight than recommended, that may be safe as long as your baby is growing and developing normally. How can unhealthy weight gain affect me and my baby? Gaining too  much weight during pregnancy can lead to pregnancy complications, such as:  A temporary form of diabetes that develops during pregnancy (gestational diabetes).  High blood pressure during pregnancy and protein in your urine (preeclampsia).  High blood pressure during pregnancy without protein in your urine (gestational hypertension).  Your baby having a high weight at birth, which may: ? Raise your risk of having a more difficult delivery or a surgical delivery (cesarean delivery, or C-section). ? Raise your child's risk of developing obesity during childhood. Not gaining enough weight can be life-threatening for your baby, and it may raise your baby's chances of:  Being born early (preterm).  Growing more slowly than normal during pregnancy (growth restriction).  Having a low weight at birth. What actions can I take to gain a healthy amount of weight during pregnancy? General instructions  Keep track of your weight gain during pregnancy.  Take over-the-counter and prescription medicines only as told by your health care provider. Take all prenatal supplements as directed.  Keep all health care visits during pregnancy (prenatal visits). These visits are a good time to discuss your weight gain. Your health care provider will weigh you at each visit to make sure you are gaining a healthy amount of weight. Nutrition   Eat a balanced, nutrient-rich diet. Eat plenty of: ? Fruits and vegetables, such as berries and broccoli. ? Whole grains, such as millet, barley, whole-wheat breads and cereals, and oatmeal. ? Low-fat dairy products or non-dairy products such as almond milk or rice milk. ? Protein foods, such as lean meat, chicken, eggs, and legumes (such as peas, beans, soybeans, and lentils).  Avoid foods that are fried or have a lot of fat, salt (sodium), or sugar.  Drink enough fluid to keep your urine pale yellow.  Choose healthy snack and drink options when you are at work or on  the go: ? Drink water. Avoid soda, sports drinks, and juices that have added sugar. ? Avoid drinks with caffeine, such as coffee and energy drinks. ? Eat snacks that are high in protein, such as nuts, protein bars, and low-fat yogurt. ? Carry convenient snacks in your purse that do not need refrigeration, such as a pack of trail mix, an apple, or a granola bar.  If you need help improving your diet, work with a health care provider or a diet and nutrition specialist (dietitian). Activity   Exercise regularly, as told by your health care provider. ? If you were active before becoming pregnant, you may be able to continue your regular fitness activities. ? If you were not active before pregnancy, you may gradually build up to exercising for 30 or more minutes on most days of the week. This may  include walking, swimming, or yoga.  Ask your health care provider what activities are safe for you. Talk with your health care provider about whether you may need to be excused from certain school or work activities. Where to find more information Learn more about managing your weight gain during pregnancy from:  American Pregnancy Association: www.americanpregnancy.org  [place] Department of Agriculture pregnancy weight gain calculator: [employer] Summary  Too much weight gain during pregnancy can lead to complications for you and your baby.  Find out your pre-pregnancy BMI to determine how much weight gain is healthy for you.  Eat nutritious foods and stay active.  Keep all of your prenatal visits as told by your health care provider. This information is not intended to replace advice given to you by your health care provider. Make sure you discuss any questions you have with your health care provider. Document Revised: 08/07/2019 Document Reviewed: 08/04/2017 Elsevier Patient Education  [place].   Exercise During Pregnancy Exercise is an important part of being healthy  for people of all ages. Exercise improves the function of your heart and lungs and helps you maintain strength, flexibility, and a healthy body weight. Exercise also boosts energy levels and elevates mood. Most women should exercise regularly during pregnancy. In rare cases, women with certain medical conditions or complications may be asked to limit or avoid exercise during pregnancy. How does this affect me? Along with maintaining general strength and flexibility, exercising during pregnancy can help:  Keep strength in muscles that are used during labor and childbirth.  Decrease low back pain.  Reduce symptoms of depression.  Control weight gain during pregnancy.  Reduce the risk of needing insulin if you develop diabetes during pregnancy.  Decrease the risk of cesarean delivery.  Speed up your recovery after giving birth. How does this affect my baby? Exercise can help you have a healthy pregnancy. Exercise does not cause premature birth. It will not cause your baby to weigh less at birth. What exercises can I do? Many exercises are safe for you to do during pregnancy. Do a variety of exercises that safely increase your heart and breathing rates and help you build and maintain muscle strength. Do exercises exactly as told by your health care provider. You may do these exercises:  Walking or hiking.  Swimming.  Water aerobics.  Riding a stationary bike.  Strength training.  Modified yoga or Pilates. Tell your instructor that you are pregnant. Avoid overstretching, and avoid lying on your back for long periods of time.  Running or jogging. Only choose this type of exercise if you: ? Ran or jogged regularly before your pregnancy. ? Can run or jog and still talk in complete sentences. What exercises should I avoid? Depending on your level of fitness and whether you exercised regularly before your pregnancy, you may be told to limit high-intensity exercise. You can tell that you  are exercising at a high intensity if you are breathing much harder and faster and cannot hold a conversation while exercising. You must avoid:  Contact sports.  Activities that put you at risk for falling on or being hit in the belly, such as downhill skiing, water skiing, surfing, rock climbing, cycling, gymnastics, and horseback riding.  Scuba diving.  Skydiving.  Yoga or Pilates in a room that is heated to high temperatures.  Jogging or running, unless you ran or jogged regularly before your pregnancy. While jogging or running, you should always be able to talk in full sentences.  Do not run or jog so fast that you are unable to have a conversation.  Do not exercise at more than 6,000 feet above sea level (high elevation) if you are not used to exercising at high elevation. How do I exercise in a safe way?   Avoid overheating. Do not exercise in very high temperatures.  Wear loose-fitting, breathable clothes.  Avoid dehydration. Drink enough water before, during, and after exercise to keep your urine pale yellow.  Avoid overstretching. Because of hormone changes during pregnancy, it is easy to overstretch muscles, tendons, and ligaments during pregnancy.  Start slowly and ask your health care provider to recommend the types of exercise that are safe for you.  Do not exercise to lose weight. Follow these instructions at home:  Exercise on most days or all days of the week. Try to exercise for 30 minutes a day, 5 days a week, unless your health care provider tells you not to.  If you actively exercised before your pregnancy and you are healthy, your health care provider may tell you to continue to do moderate to high-intensity exercise.  If you are just starting to exercise or did not exercise much before your pregnancy, your health care provider may tell you to do low to moderate-intensity exercise. Questions to ask your health care provider  Is exercise safe for me?  What  are signs that I should stop exercising?  Does my health condition mean that I should not exercise during pregnancy?  When should I avoid exercising during pregnancy? Stop exercising and contact a health care provider if: You have any unusual symptoms, such as:  Mild contractions of the uterus or cramps in the abdomen.  Dizziness that does not go away when you rest. Stop exercising and get help right away if: You have any unusual symptoms, such as:  Sudden, severe pain in your low back or your belly.  Mild contractions of the uterus or cramps in the abdomen that do not improve with rest and drinking fluids.  Chest pain.  Bleeding or fluid leaking from your vagina.  Shortness of breath. These symptoms may represent a serious problem that is an emergency. Do not wait to see if the symptoms will go away. Get medical help right away. Call your local emergency services (911 in the [place] Do not drive yourself to the hospital. Summary  Most women should exercise regularly throughout pregnancy. In rare cases, women with certain medical conditions or complications may be asked to limit or avoid exercise during pregnancy.  Do not exercise to lose weight during pregnancy.  Your health care provider will tell you what level of physical activity is right for you.  Stop exercising and contact a health care provider if you have mild contractions of the uterus or cramps in the abdomen. Get help right away if these contractions or cramps do not improve with rest and drinking fluids.  Stop exercising and get help right away if you have sudden, severe pain in your low back or belly, chest pain, shortness of breath, or bleeding or leaking of fluid from your vagina. This information is not intended to replace advice given to you by your health care provider. Make sure you discuss any questions you have with your health care provider. Document Revised: 03/07/2019 Document Reviewed:  12/19/2018 Elsevier Patient Education  [place].    Common Medications Safe in Pregnancy  Acne:      Constipation:  Benzoyl Peroxide     Colace  Clindamycin      Dulcolax Suppository  Topica Erythromycin     Fibercon  Salicylic Acid      Metamucil         Miralax AVOID:        Senakot   Accutane    Cough:  Retin-A       Cough Drops  Tetracycline      Phenergan w/ Codeine if Rx  Minocycline      Robitussin (Plain & DM)  Antibiotics:     Crabs/Lice:  Ceclor       RID  Cephalosporins    AVOID:  E-Mycins      Kwell  Keflex  Macrobid/Macrodantin   Diarrhea:  Penicillin      Kao-Pectate  Zithromax      Imodium AD         PUSH FLUIDS AVOID:       Cipro     Fever:  Tetracycline      Tylenol (Regular or Extra  Minocycline       Strength)  Levaquin      Extra Strength-Do not          Exceed 8 tabs/24 hrs Caffeine:        '200mg'$ /day (equiv. To 1 cup of coffee or  approx. 3 12 oz sodas)         Gas: Cold/Hayfever:       Gas-X  Benadryl      Mylicon  Claritin       Phazyme  **Claritin-D        Chlor-Trimeton    Headaches:  Dimetapp      ASA-Free Excedrin  Drixoral-Non-Drowsy     Cold Compress  Mucinex (Guaifenasin)     Tylenol (Regular or Extra  Sudafed/Sudafed-12 Hour     Strength)  **Sudafed PE Pseudoephedrine   Tylenol Cold & Sinus     Vicks Vapor Rub  Zyrtec  **AVOID if Problems With Blood Pressure         Heartburn: Avoid lying down for at least 1 hour after meals  Aciphex      Maalox     Rash:  Milk of Magnesia     Benadryl    Mylanta       1% Hydrocortisone Cream  Pepcid  Pepcid Complete   Sleep Aids:  Prevacid      Ambien   Prilosec       Benadryl  Rolaids       Chamomile Tea  Tums (Limit 4/day)     Unisom  Zantac       Tylenol PM         Warm milk-add vanilla or  Hemorrhoids:       Sugar for taste  Anusol/Anusol H.C.  (RX: Analapram 2.5%)  Sugar Substitutes:  Hydrocortisone OTC     Ok in moderation  Preparation  H      Tucks        Vaseline lotion applied to tissue with wiping    Herpes:     Throat:  Acyclovir      Oragel  Famvir  Valtrex     Vaccines:         Flu Shot Leg Cramps:       *Gardasil  Benadryl      Hepatitis A         Hepatitis B Nasal Spray:       Pneumovax  Saline Nasal Spray     Polio Booster  Tetanus Nausea:       Tuberculosis test or PPD  Vitamin B6 25 mg TID   AVOID:    Dramamine      *Gardasil  Emetrol       Live Poliovirus  Ginger Root 250 mg QID    MMR (measles, mumps &  High Complex Carbs @ Bedtime    rebella)  Sea Bands-Accupressure    Varicella (Chickenpox)  Unisom 1/2 tab TID     *No known complications           If received before Pain:         Known pregnancy;   Darvocet       Resume series after  Lortab        Delivery  Percocet    Yeast:   Tramadol      Femstat  Tylenol 3      Gyne-lotrimin  Ultram       Monistat  Vicodin           MISC:         All Sunscreens           Hair Coloring/highlights          Insect Repellant's          (Including DEET)         Mystic Tans    Second Trimester of Pregnancy  The second trimester is from week 14 through week 27 (month 4 through 6). This is often the time in pregnancy that you feel your best. Often times, morning sickness has lessened or quit. You may have more energy, and you may get hungry more often. Your unborn baby is growing rapidly. At the end of the sixth month, he or she is about 9 inches long and weighs about 1 pounds. You will likely feel the baby move between 18 and 20 weeks of pregnancy. Follow these instructions at home: Medicines  Take over-the-counter and prescription medicines only as told by your doctor. Some medicines are safe and some medicines are not safe during pregnancy.  Take a prenatal vitamin that contains at least 600 micrograms (mcg) of folic acid.  If you have trouble pooping (constipation), take medicine that will make your stool soft (stool softener) if your doctor  approves. Eating and drinking   Eat regular, healthy meals.  Avoid raw meat and uncooked cheese.  If you get low calcium from the food you eat, talk to your doctor about taking a daily calcium supplement.  Avoid foods that are high in fat and sugars, such as fried and sweet foods.  If you feel sick to your stomach (nauseous) or throw up (vomit): ? Eat 4 or 5 small meals a day instead of 3 large meals. ? Try eating a few soda crackers. ? Drink liquids between meals instead of during meals.  To prevent constipation: ? Eat foods that are high in fiber, like fresh fruits and vegetables, whole grains, and beans. ? Drink enough fluids to keep your pee (urine) clear or pale yellow. Activity  Exercise only as told by your doctor. Stop exercising if you start to have cramps.  Do not exercise if it is too hot, too humid, or if you are in a place of great height (high altitude).  Avoid heavy lifting.  Wear low-heeled shoes. Sit and stand up straight.  You can continue to have sex unless your doctor tells you not to. Relieving pain and discomfort  Wear a good support bra if your  breasts are tender.  Take warm water baths (sitz baths) to soothe pain or discomfort caused by hemorrhoids. Use hemorrhoid cream if your doctor approves.  Rest with your legs raised if you have leg cramps or low back pain.  If you develop puffy, bulging veins (varicose veins) in your legs: ? Wear support hose or compression stockings as told by your doctor. ? Raise (elevate) your feet for 15 minutes, 3-4 times a day. ? Limit salt in your food. Prenatal care  Write down your questions. Take them to your prenatal visits.  Keep all your prenatal visits as told by your doctor. This is important. Safety  Wear your seat belt when driving.  Make a list of emergency phone numbers, including numbers for family, friends, the hospital, and police and fire departments. General instructions  Ask your doctor  about the right foods to eat or for help finding a counselor, if you need these services.  Ask your doctor about local prenatal classes. Begin classes before month 6 of your pregnancy.  Do not use hot tubs, steam rooms, or saunas.  Do not douche or use tampons or scented sanitary pads.  Do not cross your legs for long periods of time.  Visit your dentist if you have not done so. Use a soft toothbrush to brush your teeth. Floss gently.  Avoid all smoking, herbs, and alcohol. Avoid drugs that are not approved by your doctor.  Do not use any products that contain nicotine or tobacco, such as cigarettes and e-cigarettes. If you need help quitting, ask your doctor.  Avoid cat litter boxes and soil used by cats. These carry germs that can cause birth defects in the baby and can cause a loss of your baby (miscarriage) or stillbirth. Contact a doctor if:  You have mild cramps or pressure in your lower belly.  You have pain when you pee (urinate).  You have bad smelling fluid coming from your vagina.  You continue to feel sick to your stomach (nauseous), throw up (vomit), or have watery poop (diarrhea).  You have a nagging pain in your belly area.  You feel dizzy. Get help right away if:  You have a fever.  You are leaking fluid from your vagina.  You have spotting or bleeding from your vagina.  You have severe belly cramping or pain.  You lose or gain weight rapidly.  You have trouble catching your breath and have chest pain.  You notice sudden or extreme puffiness (swelling) of your face, hands, ankles, feet, or legs.  You have not felt the baby move in over an hour.  You have severe headaches that do not go away when you take medicine.  You have trouble seeing. Summary  The second trimester is from week 14 through week 27 (months 4 through 6). This is often the time in pregnancy that you feel your best.  To take care of yourself and your unborn baby, you will need to  eat healthy meals, take medicines only if your doctor tells you to do so, and do activities that are safe for you and your baby.  Call your doctor if you get sick or if you notice anything unusual about your pregnancy. Also, call your doctor if you need help with the right food to eat, or if you want to know what activities are safe for you. This information is not intended to replace advice given to you by your health care provider. Make sure you discuss any questions you  have with your health care provider. Document Revised: 03/08/2019 Document Reviewed: 12/20/2016 Elsevier Patient Education  [place].

## 2020-04-04 ENCOUNTER — Encounter: Payer: Self-pay | Admitting: Certified Nurse Midwife

## 2020-04-04 ENCOUNTER — Other Ambulatory Visit (INDEPENDENT_AMBULATORY_CARE_PROVIDER_SITE_OTHER): Payer: Medicare Other | Admitting: Certified Nurse Midwife

## 2020-04-04 DIAGNOSIS — Z98891 History of uterine scar from previous surgery: Secondary | ICD-10-CM

## 2020-04-04 DIAGNOSIS — O99012 Anemia complicating pregnancy, second trimester: Secondary | ICD-10-CM | POA: Insufficient documentation

## 2020-04-04 DIAGNOSIS — D649 Anemia, unspecified: Secondary | ICD-10-CM

## 2020-04-04 DIAGNOSIS — O99019 Anemia complicating pregnancy, unspecified trimester: Secondary | ICD-10-CM | POA: Insufficient documentation

## 2020-04-04 DIAGNOSIS — Z3A22 22 weeks gestation of pregnancy: Secondary | ICD-10-CM

## 2020-04-04 DIAGNOSIS — Z3492 Encounter for supervision of normal pregnancy, unspecified, second trimester: Secondary | ICD-10-CM

## 2020-04-04 LAB — URINALYSIS, ROUTINE W REFLEX MICROSCOPIC
Bilirubin, UA: NEGATIVE
Glucose, UA: NEGATIVE
Ketones, UA: NEGATIVE
Leukocytes,UA: NEGATIVE
Nitrite, UA: POSITIVE — AB
Protein,UA: NEGATIVE
RBC, UA: NEGATIVE
Specific Gravity, UA: 1.025 (ref 1.005–1.030)
Urobilinogen, Ur: 1 mg/dL (ref 0.2–1.0)
pH, UA: 5.5 (ref 5.0–7.5)

## 2020-04-04 LAB — MICROSCOPIC EXAMINATION
Casts: NONE SEEN /lpf
RBC, Urine: NONE SEEN /hpf (ref 0–2)

## 2020-04-04 LAB — CBC WITH DIFFERENTIAL
Basophils Absolute: 0 10*3/uL (ref 0.0–0.2)
Basos: 0 %
EOS (ABSOLUTE): 0.1 10*3/uL (ref 0.0–0.4)
Eos: 1 %
Hematocrit: 21.9 % — ABNORMAL LOW (ref 34.0–46.6)
Hemoglobin: 6.2 g/dL — CL (ref 11.1–15.9)
Immature Grans (Abs): 0.1 10*3/uL (ref 0.0–0.1)
Immature Granulocytes: 1 %
Lymphocytes Absolute: 2.8 10*3/uL (ref 0.7–3.1)
Lymphs: 30 %
MCH: 18.7 pg — ABNORMAL LOW (ref 26.6–33.0)
MCHC: 28.3 g/dL — ABNORMAL LOW (ref 31.5–35.7)
MCV: 66 fL — ABNORMAL LOW (ref 79–97)
Monocytes Absolute: 0.5 10*3/uL (ref 0.1–0.9)
Monocytes: 6 %
Neutrophils Absolute: 5.7 10*3/uL (ref 1.4–7.0)
Neutrophils: 62 %
RBC: 3.31 x10E6/uL — ABNORMAL LOW (ref 3.77–5.28)
RDW: 16.6 % — ABNORMAL HIGH (ref 11.7–15.4)
WBC: 9.2 10*3/uL (ref 3.4–10.8)

## 2020-04-04 LAB — ABO AND RH: Rh Factor: POSITIVE

## 2020-04-04 LAB — HEPATITIS B SURFACE ANTIGEN: Hepatitis B Surface Ag: NEGATIVE

## 2020-04-04 LAB — VARICELLA ZOSTER ANTIBODY, IGG: Varicella zoster IgG: 887 index (ref >165)

## 2020-04-04 LAB — HIV ANTIBODY (ROUTINE TESTING W REFLEX): HIV Screen 4th Generation wRfx: NONREACTIVE

## 2020-04-04 LAB — RUBELLA SCREEN: Rubella Antibodies, IGG: 1.58 index (ref >0.99)

## 2020-04-04 LAB — RPR: RPR Ser Ql: NONREACTIVE

## 2020-04-04 LAB — ANTIBODY SCREEN: Antibody Screen: NEGATIVE

## 2020-04-04 NOTE — Progress Notes (Signed)
Referral to Hematology/Oncology due to severe anemia, see orders.    Gunnar Bulla, CNM [facility], [facility] 04/04/20 10:41 AM

## 2020-04-06 ENCOUNTER — Other Ambulatory Visit: Payer: Self-pay | Admitting: Certified Nurse Midwife

## 2020-04-06 ENCOUNTER — Telehealth: Payer: Self-pay | Admitting: Certified Nurse Midwife

## 2020-04-06 DIAGNOSIS — Z3492 Encounter for supervision of normal pregnancy, unspecified, second trimester: Secondary | ICD-10-CM

## 2020-04-06 DIAGNOSIS — D649 Anemia, unspecified: Secondary | ICD-10-CM

## 2020-04-06 DIAGNOSIS — Z98891 History of uterine scar from previous surgery: Secondary | ICD-10-CM

## 2020-04-06 DIAGNOSIS — O2342 Unspecified infection of urinary tract in pregnancy, second trimester: Secondary | ICD-10-CM

## 2020-04-06 DIAGNOSIS — Z3A23 23 weeks gestation of pregnancy: Secondary | ICD-10-CM

## 2020-04-06 LAB — GC/CHLAMYDIA PROBE AMP
Chlamydia trachomatis, NAA: NEGATIVE
Neisseria Gonorrhoeae by PCR: NEGATIVE

## 2020-04-06 LAB — URINE CULTURE

## 2020-04-06 MED ORDER — NITROFURANTOIN MONOHYD MACRO 100 MG PO CAPS: 100.0000 mg | ORAL_CAPSULE | Freq: Two times a day (BID) | ORAL | 0 refills | Status: DC

## 2020-04-06 NOTE — Telephone Encounter (Signed)
Telephone call to patient as request via MyChart. Left HIPPA approved message on identified line.   MyChart message sent.   Advised to call back with further needs, questions or concerns.    Gunnar Bulla, CNM [facility], [facility] 04/06/20 5:10 PM

## 2020-04-07 ENCOUNTER — Other Ambulatory Visit: Payer: Self-pay

## 2020-04-08 ENCOUNTER — Inpatient Hospital Stay: Payer: Medicare Other | Admitting: Internal Medicine

## 2020-04-08 ENCOUNTER — Inpatient Hospital Stay: Payer: Medicare Other | Attending: Internal Medicine | Admitting: Internal Medicine

## 2020-04-08 ENCOUNTER — Inpatient Hospital Stay: Payer: Medicare Other

## 2020-04-08 DIAGNOSIS — R5381 Other malaise: Secondary | ICD-10-CM | POA: Insufficient documentation

## 2020-04-08 DIAGNOSIS — O99012 Anemia complicating pregnancy, second trimester: Secondary | ICD-10-CM | POA: Insufficient documentation

## 2020-04-08 DIAGNOSIS — O99612 Diseases of the digestive system complicating pregnancy, second trimester: Secondary | ICD-10-CM | POA: Insufficient documentation

## 2020-04-08 DIAGNOSIS — Z79899 Other long term (current) drug therapy: Secondary | ICD-10-CM | POA: Insufficient documentation

## 2020-04-08 DIAGNOSIS — K5900 Constipation, unspecified: Secondary | ICD-10-CM | POA: Insufficient documentation

## 2020-04-08 DIAGNOSIS — R0602 Shortness of breath: Secondary | ICD-10-CM | POA: Insufficient documentation

## 2020-04-08 DIAGNOSIS — R5383 Other fatigue: Secondary | ICD-10-CM | POA: Insufficient documentation

## 2020-04-09 ENCOUNTER — Encounter: Payer: Self-pay | Admitting: Internal Medicine

## 2020-04-09 ENCOUNTER — Other Ambulatory Visit: Payer: Self-pay

## 2020-04-09 ENCOUNTER — Ambulatory Visit
Admission: RE | Admit: 2020-04-09 | Discharge: 2020-04-09 | Disposition: A | Discharge status: Home / Self Care | Payer: Medicare Other | Source: Ambulatory Visit | Attending: Certified Nurse Midwife | Admitting: Certified Nurse Midwife

## 2020-04-09 DIAGNOSIS — Z3A24 24 weeks gestation of pregnancy: Secondary | ICD-10-CM | POA: Insufficient documentation

## 2020-04-09 DIAGNOSIS — Z3689 Encounter for other specified antenatal screening: Secondary | ICD-10-CM | POA: Diagnosis present

## 2020-04-09 DIAGNOSIS — Z3A22 22 weeks gestation of pregnancy: Secondary | ICD-10-CM

## 2020-04-10 ENCOUNTER — Emergency Department
Admission: EM | Admit: 2020-04-10 | Payer: Medicare Other | Source: Home / Self Care | Admitting: Obstetrics and Gynecology

## 2020-04-10 ENCOUNTER — Telehealth: Payer: Self-pay | Admitting: Internal Medicine

## 2020-04-10 ENCOUNTER — Other Ambulatory Visit: Payer: Self-pay

## 2020-04-10 ENCOUNTER — Other Ambulatory Visit: Payer: Self-pay | Admitting: *Deleted

## 2020-04-10 ENCOUNTER — Inpatient Hospital Stay (HOSPITAL_BASED_OUTPATIENT_CLINIC_OR_DEPARTMENT_OTHER): Payer: Medicare Other | Admitting: Internal Medicine

## 2020-04-10 ENCOUNTER — Other Ambulatory Visit (INDEPENDENT_AMBULATORY_CARE_PROVIDER_SITE_OTHER): Payer: Medicare Other | Admitting: Certified Nurse Midwife

## 2020-04-10 ENCOUNTER — Other Ambulatory Visit: Payer: Self-pay | Admitting: Certified Nurse Midwife

## 2020-04-10 ENCOUNTER — Observation Stay
Admission: RE | Admit: 2020-04-10 | Discharge: 2020-04-11 | Disposition: A | Discharge status: Home / Self Care | Payer: Medicare Other | Source: Ambulatory Visit | Attending: Obstetrics and Gynecology | Admitting: Obstetrics and Gynecology

## 2020-04-10 ENCOUNTER — Encounter: Payer: Self-pay | Admitting: Internal Medicine

## 2020-04-10 ENCOUNTER — Encounter: Payer: Self-pay | Admitting: Emergency Medicine

## 2020-04-10 ENCOUNTER — Inpatient Hospital Stay: Payer: Medicare Other

## 2020-04-10 VITALS — BP 104/43 | HR 72 | Temp 96.3°F | Resp 18 | Wt 160.2 lb

## 2020-04-10 DIAGNOSIS — K5900 Constipation, unspecified: Secondary | ICD-10-CM | POA: Insufficient documentation

## 2020-04-10 DIAGNOSIS — Z3A23 23 weeks gestation of pregnancy: Secondary | ICD-10-CM

## 2020-04-10 DIAGNOSIS — Z3689 Encounter for other specified antenatal screening: Secondary | ICD-10-CM

## 2020-04-10 DIAGNOSIS — Z3492 Encounter for supervision of normal pregnancy, unspecified, second trimester: Secondary | ICD-10-CM

## 2020-04-10 DIAGNOSIS — Z79899 Other long term (current) drug therapy: Secondary | ICD-10-CM | POA: Insufficient documentation

## 2020-04-10 DIAGNOSIS — R5381 Other malaise: Secondary | ICD-10-CM | POA: Diagnosis not present

## 2020-04-10 DIAGNOSIS — O99012 Anemia complicating pregnancy, second trimester: Secondary | ICD-10-CM

## 2020-04-10 DIAGNOSIS — O99612 Diseases of the digestive system complicating pregnancy, second trimester: Secondary | ICD-10-CM | POA: Insufficient documentation

## 2020-04-10 DIAGNOSIS — O99019 Anemia complicating pregnancy, unspecified trimester: Secondary | ICD-10-CM | POA: Insufficient documentation

## 2020-04-10 DIAGNOSIS — Z3A24 24 weeks gestation of pregnancy: Secondary | ICD-10-CM | POA: Insufficient documentation

## 2020-04-10 DIAGNOSIS — O34211 Maternal care for low transverse scar from previous cesarean delivery: Secondary | ICD-10-CM | POA: Insufficient documentation

## 2020-04-10 DIAGNOSIS — R5383 Other fatigue: Secondary | ICD-10-CM | POA: Diagnosis not present

## 2020-04-10 DIAGNOSIS — R0602 Shortness of breath: Secondary | ICD-10-CM | POA: Diagnosis not present

## 2020-04-10 LAB — BUPRENORPHINE CONFIRM, URINE
Buprenorphine Confirm: 512 ng/mL
Buprenorphine: POSITIVE — AB
Buprenorphine: POSITIVE — AB
Norbuprenorphine Confirm: 812 ng/mL
Norbuprenorphine: POSITIVE — AB

## 2020-04-10 LAB — CBC WITH DIFFERENTIAL/PLATELET
Abs Immature Granulocytes: 0.05 10*3/uL (ref 0.00–0.07)
Basophils Absolute: 0 10*3/uL (ref 0.0–0.1)
Basophils Relative: 0 %
Eosinophils Absolute: 0.1 10*3/uL (ref 0.0–0.5)
Eosinophils Relative: 1 %
HCT: 20 % — ABNORMAL LOW (ref 36.0–46.0)
Hemoglobin: 5.6 g/dL — ABNORMAL LOW (ref 12.0–15.0)
Immature Granulocytes: 1 %
Lymphocytes Relative: 26 %
Lymphs Abs: 2 10*3/uL (ref 0.7–4.0)
MCH: 18.5 pg — ABNORMAL LOW (ref 26.0–34.0)
MCHC: 28 g/dL — ABNORMAL LOW (ref 30.0–36.0)
MCV: 66.2 fL — ABNORMAL LOW (ref 80.0–100.0)
Monocytes Absolute: 0.5 10*3/uL (ref 0.1–1.0)
Monocytes Relative: 6 %
Neutro Abs: 5.1 10*3/uL (ref 1.7–7.7)
Neutrophils Relative %: 66 %
Platelets: 275 10*3/uL (ref 150–400)
RBC: 3.02 MIL/uL — ABNORMAL LOW (ref 3.87–5.11)
RDW: 17.4 % — ABNORMAL HIGH (ref 11.5–15.5)
WBC: 7.9 10*3/uL (ref 4.0–10.5)
nRBC: 0 % (ref 0.0–0.2)

## 2020-04-10 LAB — PREPARE RBC (CROSSMATCH)

## 2020-04-10 LAB — MONITOR DRUG PROFILE 14(MW)
Amphetamine Scrn, Ur: NEGATIVE ng/mL
BARBITURATE SCREEN URINE: NEGATIVE ng/mL
BENZODIAZEPINE SCREEN, URINE: NEGATIVE ng/mL
CANNABINOIDS UR QL SCN: NEGATIVE ng/mL
Cocaine (Metab) Scrn, Ur: NEGATIVE ng/mL
Creatinine(Crt), U: 170.2 mg/dL (ref 20.0–300.0)
Fentanyl, Urine: NEGATIVE pg/mL
Meperidine Screen, Urine: NEGATIVE ng/mL
Methadone Screen, Urine: NEGATIVE ng/mL
OXYCODONE+OXYMORPHONE UR QL SCN: NEGATIVE ng/mL
Opiate Scrn, Ur: NEGATIVE ng/mL
Ph of Urine: 5.9 (ref 4.5–8.9)
Phencyclidine Qn, Ur: NEGATIVE ng/mL
Propoxyphene Scrn, Ur: NEGATIVE ng/mL
SPECIFIC GRAVITY: 1.026
Tramadol Screen, Urine: NEGATIVE ng/mL

## 2020-04-10 LAB — RETICULOCYTES
Immature Retic Fract: 15.3 % (ref 2.3–15.9)
RBC.: 2.96 MIL/uL — ABNORMAL LOW (ref 3.87–5.11)
Retic Count, Absolute: 41.7 10*3/uL (ref 19.0–186.0)
Retic Ct Pct: 1.4 % (ref 0.4–3.1)

## 2020-04-10 LAB — COMPREHENSIVE METABOLIC PANEL
ALT: 11 U/L (ref 0–44)
AST: 12 U/L — ABNORMAL LOW (ref 15–41)
Albumin: 2.8 g/dL — ABNORMAL LOW (ref 3.5–5.0)
Alkaline Phosphatase: 53 U/L (ref 38–126)
Anion gap: 5 (ref 5–15)
BUN: 13 mg/dL (ref 6–20)
CO2: 24 mmol/L (ref 22–32)
Calcium: 8.1 mg/dL — ABNORMAL LOW (ref 8.9–10.3)
Chloride: 106 mmol/L (ref 98–111)
Creatinine, Ser: 0.45 mg/dL (ref 0.44–1.00)
GFR calc Af Amer: >60 mL/min (ref >60)
GFR calc non Af Amer: >60 mL/min (ref >60)
Glucose, Bld: 89 mg/dL (ref 70–99)
Potassium: 3.8 mmol/L (ref 3.5–5.1)
Sodium: 135 mmol/L (ref 135–145)
Total Bilirubin: 0.5 mg/dL (ref 0.3–1.2)
Total Protein: 5.6 g/dL — ABNORMAL LOW (ref 6.5–8.1)

## 2020-04-10 LAB — IRON AND TIBC
Iron: 18 ug/dL — ABNORMAL LOW (ref 28–170)
Saturation Ratios: 3 % — ABNORMAL LOW (ref 10.4–31.8)
TIBC: 531 ug/dL — ABNORMAL HIGH (ref 250–450)
UIBC: 513 ug/dL

## 2020-04-10 LAB — VITAMIN B12: Vitamin B-12: 193 pg/mL (ref 180–914)

## 2020-04-10 LAB — FERRITIN: Ferritin: 2 ng/mL — ABNORMAL LOW (ref 11–307)

## 2020-04-10 LAB — FOLATE: Folate: 13.1 ng/mL (ref >5.9)

## 2020-04-10 LAB — LACTATE DEHYDROGENASE: LDH: 100 U/L (ref 98–192)

## 2020-04-10 MED ORDER — ACETAMINOPHEN 325 MG PO TABS: 650.0000 mg | ORAL_TABLET | Freq: Once | ORAL | Status: DC

## 2020-04-10 MED ORDER — DIPHENHYDRAMINE HCL 25 MG PO CAPS: 25.0000 mg | ORAL_CAPSULE | Freq: Once | ORAL | Status: DC

## 2020-04-10 MED ORDER — ONDANSETRON HCL 4 MG/2ML IJ SOLN: 4.0000 mg | Freq: Four times a day (QID) | INTRAMUSCULAR | Status: DC | PRN

## 2020-04-10 MED ORDER — ONDANSETRON HCL 4 MG/2ML IJ SOLN
INTRAMUSCULAR | Status: AC
  Administered 2020-04-10: 4 mg
  Filled 2020-04-10: qty 2

## 2020-04-10 MED ORDER — DIPHENHYDRAMINE HCL 25 MG PO CAPS
25.0000 mg | ORAL_CAPSULE | Freq: Once | ORAL | Status: AC
  Administered 2020-04-10: 25 mg via ORAL
  Filled 2020-04-10: qty 1

## 2020-04-10 MED ORDER — SODIUM CHLORIDE 0.9% IV SOLUTION: 250.0000 mL | Freq: Once | INTRAVENOUS | Status: DC

## 2020-04-10 MED ORDER — ACETAMINOPHEN 325 MG PO TABS
650.0000 mg | ORAL_TABLET | Freq: Once | ORAL | Status: AC
  Administered 2020-04-10: 650 mg via ORAL
  Filled 2020-04-10: qty 2

## 2020-04-10 MED ORDER — SODIUM CHLORIDE 0.9% IV SOLUTION: Freq: Once | INTRAVENOUS | Status: AC

## 2020-04-10 NOTE — Assessment & Plan Note (Addendum)
#  Severe anemia most recent hemoglobin 6.4; likely iron deficiency nutritional/pregnancy.  Patient was asymptomatic.  Recommend IV Venofer.  Discussed the potential acute infusion reactions with IV iron; which are quite rare.  Patient understands the risk; will proceed with infusions.  #Recommend checking CBC CMP LDH haptoglobin; hold tube type and cross.   Thank you Dr.Lawhorn for allowing me to participate in the care of your pleasant patient. Please do not hesitate to contact me with questions or concerns in the interim.  # DISPOSITION: # labs- today # 1 unit PRBC transfusion on 5/17. # Venofer- weekly x 4; start early next week # Follow up in 3 weeks- MD; labs- cbc; venofer- Dr.B  Addendum: Hemoglobin 5.6.  Iron studies severe iron deficiency.  Given the pregnancy/severe symptomatic disease/hemoglobin of 5.6 recommend urgent PRBC transfusion.  Discussed the patient recommend go to the emergency room.  Also discussed with patient's referring provider [name].  Recommend to insert PRBC transfusion; keep up appointments as planned next week.   

## 2020-04-10 NOTE — Telephone Encounter (Signed)
On 5/14; blood checked 5.6; recommend going to ER for PRBC trasnfusion. Pt agrees.   Pt will keep appt as planned at cancer center next week.

## 2020-04-10 NOTE — ED Triage Notes (Signed)
Pt arrives POV to triage with c/o abnormal  Labs. Pt receives iron infusions at the cancer center and her HB was low 5.6. Pt states that last week it was 6. Pt appears pale at this time in triage but is otherwise in NAD.

## 2020-04-10 NOTE — Telephone Encounter (Signed)
Just received your message regarding patient. Would it be easier for me to direct admit her as antepartum patient for blood transfusion? How many units? JML

## 2020-04-10 NOTE — ED Notes (Signed)
Pt was triaged in error in ED. Pt was not supposed to be registered or triaged in ED per [facility].

## 2020-04-10 NOTE — ED Notes (Signed)
Per MD Roxan Hockey, if pt lab work and type and screen are current then no blood draw needs to occur at his time. This RN spoke with lab who verified pt's results and type and screen.
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
## 2020-04-10 NOTE — Progress Notes (Signed)
RN called and did pre screening and assessment questions via phone a day prior to visit.  Pt denies any changes, has continued shortness of breath and very tired all the time.

## 2020-04-10 NOTE — Progress Notes (Signed)
Admission released.    Gunnar Bulla, CNM [facility], [facility] 04/10/20 6:41 PM

## 2020-04-11 DIAGNOSIS — Z3A24 24 weeks gestation of pregnancy: Secondary | ICD-10-CM | POA: Diagnosis not present

## 2020-04-11 DIAGNOSIS — O99012 Anemia complicating pregnancy, second trimester: Secondary | ICD-10-CM | POA: Diagnosis not present

## 2020-04-11 LAB — HAPTOGLOBIN: Haptoglobin: 38 mg/dL (ref 33–278)

## 2020-04-11 NOTE — Progress Notes (Signed)
Pt directly admitted to unit. IV team called after L&D nurse attempted x2. IV obtained in R antecubital.   Informed blood consent signed by pt. Educated pt on process of a blood transfusion, common side effects, potential problems, and what we would do if any of this were to occur.   Order put in to transfuse 2 units of PRBC. Will transfuse and then per Doreene Burke, CNM will discharge pt home with a follow-up appointment already scheduled for Monday. Labs to be drawn at this appointment.   Fetal heart tones were 152 at 0045.

## 2020-04-11 NOTE — Discharge Summary (Signed)
Antenatal Physician Discharge Summary  Patient ID: Renee [name] MRN: [id number] DOB/AGE: 05/14/1991 29 y.o.  Admit date: 04/10/2020 Discharge date: 04/11/2020  Admission Diagnoses: Anemia in pregnancy  Discharge Diagnoses: Same  Prenatal Procedures: Transfusion 2 units PRBC  Consults: Hemotology  Hospital Course:  Renee [name] is a 29 y.o. [phone] with IUP at [redacted]w[redacted]d admitted for blood transfusion.  She was admitted without contractions.  No leaking of fluid and no bleeding.    She was observed per protocol for blood transfusion. Fetal heart tones 152, She was deemed stable for discharge to home with outpatient follow up.  Discharge Exam: Temp:  [96.3 F (35.7 C)-98.7 F (37.1 C)] 97.8 F (36.6 C) (05/15 0037) Pulse Rate:  [61-72] 61 (05/15 0037) Resp:  [18-22] 18 (05/15 0037) BP: (89-104)/(43-74) 89/51 (05/15 0037) SpO2:  [99 %-100 %] 99 % (05/15 0037) Weight:  [72.6 kg-72.7 kg] 72.6 kg (05/14 1938) Physical Examination: CONSTITUTIONAL: Well-developed, well-nourished female in no acute distress.  SKIN: Skin is warm and dry. No rash noted. Not diaphoretic. No erythema. No pallor. [place]: Alert and oriented to person, place, and time. Normal reflexes, muscle tone coordination. No cranial nerve deficit noted. PSYCHIATRIC: Normal mood and affect. Normal behavior. Normal judgment and thought content. MUSCULOSKELETAL: Normal range of motion. ABDOMEN: Soft, nontender, nondistended, gravid.   Significant Diagnostic Studies:  Results for orders placed or performed during the hospital encounter of 04/10/20 (from the past 168 hour(s))  Prepare RBC (crossmatch)   Collection Time: 04/10/20  8:50 PM  Result Value Ref Range   Order Confirmation      ORDER PROCESSED BY BLOOD BANK Performed at [facility], [address] [place], [place] [address]   Results for orders placed or performed in visit on 04/10/20 (from the past 168 hour(s))  Prepare RBC (crossmatch)   Collection Time: 04/10/20 12:12 PM  Result Value Ref Range   Order Confirmation      ORDER PROCESSED BY BLOOD BANK Performed at [facility], [address] [place], [place] [address]   Results for orders placed or performed in visit on 04/10/20 (from the past 168 hour(s))  CBC with Differential/Platelet   Collection Time: 04/10/20 12:13 PM  Result Value Ref Range   WBC 7.9 4.0 - 10.5 K/uL   RBC 3.02 (L) 3.87 - 5.11 MIL/uL   Hemoglobin 5.6 (L) 12.0 - 15.0 g/dL   HCT 20.0 (L) 36.0 - 46.0 %   MCV 66.2 (L) 80.0 - 100.0 fL   MCH 18.5 (L) 26.0 - 34.0 pg   MCHC 28.0 (L) 30.0 - 36.0 g/dL   RDW 17.4 (H) 11.5 - 15.5 %   Platelets 275 150 - 400 K/uL   nRBC 0.0 0.0 - 0.2 %   Neutrophils Relative % 66 %   Neutro Abs 5.1 1.7 - 7.7 K/uL   Lymphocytes Relative 26 %   Lymphs Abs 2.0 0.7 - 4.0 K/uL   Monocytes Relative 6 %   Monocytes Absolute 0.5 0.1 - 1.0 K/uL   Eosinophils Relative 1 %   Eosinophils Absolute 0.1 0.0 - 0.5 K/uL   Basophils Relative 0 %   Basophils Absolute 0.0 0.0 - 0.1 K/uL   Immature Granulocytes 1 %   Abs Immature Granulocytes 0.05 0.00 - 0.07 K/uL  Comprehensive metabolic panel   Collection Time: 04/10/20 12:13 PM  Result Value Ref Range   Sodium 135 135 - 145 mmol/L   Potassium 3.8 3.5 - 5.1 mmol/L   Chloride 106 98 -  111 mmol/L   CO2 24 22 - 32 mmol/L   Glucose, Bld 89 70 - 99 mg/dL   BUN 13 6 - 20 mg/dL   Creatinine, Ser 6.78 0.44 - 1.00 mg/dL   Calcium 8.1 (L) 8.9 - 10.3 mg/dL   Total Protein 5.6 (L) 6.5 - 8.1 g/dL   Albumin 2.8 (L) 3.5 - 5.0 g/dL   AST 12 (L) 15 - 41 U/L   ALT 11 0 - 44 U/L   Alkaline Phosphatase 53 38 - 126 U/L   Total Bilirubin 0.5 0.3 - 1.2 mg/dL   GFR calc non Af Amer >60 >60 mL/min   GFR calc Af Amer >60 >60 mL/min   Anion gap 5 5 - 15  Ferritin   Collection Time: 04/10/20 12:13 PM  Result Value Ref Range   Ferritin 2 (L) 11 - 307 ng/mL  Folate   Collection Time: 04/10/20 12:13 PM  Result Value Ref Range   Folate  13.1 >5.9 ng/mL  Iron and TIBC   Collection Time: 04/10/20 12:13 PM  Result Value Ref Range   Iron 18 (L) 28 - 170 ug/dL   TIBC 938 (H) 101 - 751 ug/dL   Saturation Ratios 3 (L) 10.4 - 31.8 %   UIBC 513 ug/dL  Lactate dehydrogenase   Collection Time: 04/10/20 12:13 PM  Result Value Ref Range   LDH 100 98 - 192 U/L  Reticulocytes   Collection Time: 04/10/20 12:13 PM  Result Value Ref Range   Retic Ct Pct 1.4 0.4 - 3.1 %   RBC. 2.96 (L) 3.87 - 5.11 MIL/uL   Retic Count, Absolute 41.7 19.0 - 186.0 K/uL   Immature Retic Fract 15.3 2.3 - 15.9 %  Vitamin B12   Collection Time: 04/10/20 12:13 PM  Result Value Ref Range   Vitamin B-12 193 180 - 914 pg/mL  Type and screen   Collection Time: 04/10/20 12:47 PM  Result Value Ref Range   ABO/RH(D) A POS    Antibody Screen NEG    Sample Expiration 04/13/2020,2359    Unit Number [id number]    Blood Component Type RED CELLS,LR    Unit division 00    Status of Unit ISSUED    Transfusion Status OK TO TRANSFUSE    Crossmatch Result      Compatible Performed at [facility], [address] [place], [place] [address]    Unit Number [id number]    Blood Component Type RED CELLS,LR    Unit division 00    Status of Unit ALLOCATED    Transfusion Status OK TO TRANSFUSE    Crossmatch Result Compatible   BPAM RBC   Collection Time: 04/10/20 12:47 PM  Result Value Ref Range   ISSUE DATE / TIME [id number]    Blood Product Unit Number [id number]    PRODUCT CODE J6734L93    Unit Type and Rh 6200    Blood Product Expiration Date 790240973532    Blood Product Unit Number [id number]    PRODUCT CODE I2979G92    Unit Type and Rh 6200    Blood Product Expiration Date [id number]    US OB Comp + 14 Wk  Result Date: 04/09/2020 CLINICAL DATA:  Assess fetal anatomy EXAM: OBSTETRICAL ULTRASOUND >14 WKS FINDINGS: Number of Fetuses: 1 Heart Rate:  145 bpm Movement: Yes Presentation: Cephalic Previa: No Placental Location:  Posterior Amniotic Fluid (Subjective): Normal Amniotic Fluid (Objective): Vertical pocket = 4.59cm FETAL BIOMETRY BPD: 5.9cm 24w 0d HC:   20.9cm 23w  0d AC:   18.6cm 23w 3d FL:   4.4cm 24w 4d Current Mean GA: 24w 0d [place] EDC: 07/30/2020 Assigned GA:  23w 5d Assigned EDC: 08/01/2020 FETAL ANATOMY Lateral Ventricles: Appears normal Thalami/CSP: Appears normal Posterior Fossa:  Appears normal Nuchal Region: Not visualized   NFT= N/a Upper Lip: Appears normal Spine: Not well visualized 4 Chamber Heart on Left: Appears normal LVOT: Appears normal RVOT: Appears normal Stomach on Left: Appears normal 3 Vessel Cord: Appears normal Cord Insertion site: Appears normal Kidneys: Appears normal Bladder: Appears normal Extremities: Appears normal Sex: Female Technically difficult due to: Fetal position Maternal Findings: Cervix:  3.1 cm IMPRESSION: 1. Single live intrauterine pregnancy as above, estimated age 44 weeks and 0 days. 2. Suboptimal visualization of the nuchal region and spine, due to fetal position. The remainder of the anatomic survey was unremarkable. Short interval follow-up could be performed. Electronically Signed   By: Sharlet Salina M.D.   On: 04/09/2020 20:47    Future Appointments  Date Time Provider Department Center  04/13/2020  9:00 AM [facility] INFUSION CHAIR 12 [facility] None  04/14/2020  1:30 PM [facility] INFUSION CHAIR 18 [facility] None  04/21/2020  8:00 AM [facility] [place] [facility] None  04/21/2020  9:45 AM Doreene Burke, CNM [facility] None  04/21/2020  1:30 PM [facility] INFUSION CHAIR 7 [facility] None  05/01/2020 10:30 AM [facility] LAB [facility] None  05/01/2020 11:00 AM Earna Coder, MD [facility] None  05/01/2020 11:30 AM [facility] INFUSION CHAIR 16 [facility] None    Discharge Condition: Stable  Discharge disposition: 01-Home or Self Care        Allergies as of 04/11/2020      Reactions   Hydrocodone Itching   Morphine And Related Rash   Penicillins Rash   Has patient had a  PCN reaction causing immediate rash, facial/tongue/throat swelling, SOB or lightheadedness with hypotension: Yes Has patient had a PCN reaction causing severe rash involving mucus membranes or skin necrosis: No Has patient had a PCN reaction that required hospitalization: No Has patient had a PCN reaction occurring within the last 10 years: No If all of the above answers are "NO", then may proceed with Cephalosporin use.      Medication List    TAKE these medications   acetaminophen 500 MG tablet Commonly known as: TYLENOL Take 2 tablets (1,000 mg total) by mouth every 6 (six) hours as needed for mild pain or moderate pain.   buprenorphine 8 MG Subl SL tablet Commonly known as: SUBUTEX Place 8 mg under the tongue 2 (two) times a day.   CitraNatal Bloom 90-1 MG Tabs Take 1 tablet by mouth daily.   docusate sodium 100 MG capsule Commonly known as: COLACE Take 1 capsule (100 mg total) by mouth 2 (two) times daily as needed.   nitrofurantoin (macrocrystal-monohydrate) 100 MG capsule Commonly known as: MACROBID Take 1 capsule (100 mg total) by mouth 2 (two) times daily.   polyethylene glycol powder 17 GM/SCOOP powder Commonly known as: GLYCOLAX/MIRALAX TAKE 238 G BY MOUTH ONCE FOR ONE DOSE. TO USE EVERY THIRD DAY IF NO BOWEL MOVEMENT OCCURS SPONTANEOUSLY        Signed: Doreene Burke M.D. 04/11/2020, 12:55 AM
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
## 2020-04-11 NOTE — Progress Notes (Signed)
Patient discharged home. Discharge instructions and prescriptions given and reviewed with patient. Patient verbalized understanding.  Escorted out by staff.   

## 2020-04-12 LAB — TYPE AND SCREEN
ABO/RH(D): A POS
Antibody Screen: NEGATIVE
Unit division: 0
Unit division: 0

## 2020-04-12 LAB — BPAM RBC
Blood Product Expiration Date: 202105182359
Blood Product Expiration Date: 202105252359
ISSUE DATE / TIME: 202105142234
ISSUE DATE / TIME: 202105150115
Unit Type and Rh: 6200
Unit Type and Rh: 6200

## 2020-04-13 ENCOUNTER — Inpatient Hospital Stay: Payer: Medicare Other

## 2020-04-14 ENCOUNTER — Inpatient Hospital Stay: Payer: Medicare Other

## 2020-04-15 ENCOUNTER — Inpatient Hospital Stay: Payer: Medicare Other

## 2020-04-16 ENCOUNTER — Other Ambulatory Visit: Payer: Self-pay | Admitting: Internal Medicine

## 2020-04-20 ENCOUNTER — Encounter: Payer: Medicare Other | Admitting: Certified Nurse Midwife

## 2020-04-21 ENCOUNTER — Other Ambulatory Visit: Payer: Medicare Other

## 2020-04-21 ENCOUNTER — Inpatient Hospital Stay: Payer: Medicare Other

## 2020-04-21 ENCOUNTER — Encounter: Payer: Medicare Other | Admitting: Certified Nurse Midwife

## 2020-04-29 ENCOUNTER — Other Ambulatory Visit: Payer: Self-pay

## 2020-04-29 ENCOUNTER — Ambulatory Visit (INDEPENDENT_AMBULATORY_CARE_PROVIDER_SITE_OTHER): Payer: Medicare Other | Admitting: Certified Nurse Midwife

## 2020-04-29 ENCOUNTER — Ambulatory Visit (INDEPENDENT_AMBULATORY_CARE_PROVIDER_SITE_OTHER): Payer: Medicare Other

## 2020-04-29 VITALS — BP 95/48 | HR 71 | Wt 159.4 lb

## 2020-04-29 DIAGNOSIS — Z3A26 26 weeks gestation of pregnancy: Secondary | ICD-10-CM

## 2020-04-29 DIAGNOSIS — Z3689 Encounter for other specified antenatal screening: Secondary | ICD-10-CM | POA: Diagnosis not present

## 2020-04-29 DIAGNOSIS — O2343 Unspecified infection of urinary tract in pregnancy, third trimester: Secondary | ICD-10-CM

## 2020-04-29 DIAGNOSIS — Z3A23 23 weeks gestation of pregnancy: Secondary | ICD-10-CM

## 2020-04-29 DIAGNOSIS — Z3492 Encounter for supervision of normal pregnancy, unspecified, second trimester: Secondary | ICD-10-CM

## 2020-04-29 NOTE — Patient Instructions (Signed)
Glucose Tolerance Test During Pregnancy Why am I having this test? The glucose tolerance test (GTT) is done to check how your body processes sugar (glucose). This is one of several tests used to diagnose diabetes that develops during pregnancy (gestational diabetes mellitus). Gestational diabetes is a temporary form of diabetes that some women develop during pregnancy. It usually occurs during the second trimester of pregnancy and goes away after delivery. Testing (screening) for gestational diabetes usually occurs between 24 and 28 weeks of pregnancy. You may have the GTT test after having a 1-hour glucose screening test if the results from that test indicate that you may have gestational diabetes. You may also have this test if:  You have a history of gestational diabetes.  You have a history of giving birth to very large babies or have experienced repeated fetal loss (stillbirth).  You have signs and symptoms of diabetes, such as: ? Changes in your vision. ? Tingling or numbness in your hands or feet. ? Changes in hunger, thirst, and urination that are not otherwise explained by your pregnancy. What is being tested? This test measures the amount of glucose in your blood at different times during a period of 3 hours. This indicates how well your body is able to process glucose. What kind of sample is taken?  Blood samples are required for this test. They are usually collected by inserting a needle into a blood vessel. How do I prepare for this test?  For 3 days before your test, eat normally. Have plenty of carbohydrate-rich foods.  Follow instructions from your health care provider about: ? Eating or drinking restrictions on the day of the test. You may be asked to not eat or drink anything other than water (fast) starting 8-10 hours before the test. ? Changing or stopping your regular medicines. Some medicines may interfere with this test. Tell a health care provider about:  All  medicines you are taking, including vitamins, herbs, eye drops, creams, and over-the-counter medicines.  Any blood disorders you have.  Any surgeries you have had.  Any medical conditions you have. What happens during the test? First, your blood glucose will be measured. This is referred to as your fasting blood glucose, since you fasted before the test. Then, you will drink a glucose solution that contains a certain amount of glucose. Your blood glucose will be measured again 1, 2, and 3 hours after drinking the solution. This test takes about 3 hours to complete. You will need to stay at the testing location during this time. During the testing period:  Do not eat or drink anything other than the glucose solution.  Do not exercise.  Do not use any products that contain nicotine or tobacco, such as cigarettes and e-cigarettes. If you need help stopping, ask your health care provider. The testing procedure may vary among health care providers and hospitals. How are the results reported? Your results will be reported as milligrams of glucose per deciliter of blood (mg/dL) or millimoles per liter (mmol/L). Your health care provider will compare your results to normal ranges that were established after testing a large group of people (reference ranges). Reference ranges may vary among labs and hospitals. For this test, common reference ranges are:  Fasting: less than 95-105 mg/dL (5.3-5.8 mmol/L).  1 hour after drinking glucose: less than 180-190 mg/dL (10.0-10.5 mmol/L).  2 hours after drinking glucose: less than 155-165 mg/dL (8.6-9.2 mmol/L).  3 hours after drinking glucose: 140-145 mg/dL (7.8-8.1 mmol/L). What do the   results mean? Results within reference ranges are considered normal, meaning that your glucose levels are well-controlled. If two or more of your blood glucose levels are high, you may be diagnosed with gestational diabetes. If only one level is high, your health care  provider may suggest repeat testing or other tests to confirm a diagnosis. Talk with your health care provider about what your results mean. Questions to ask your health care provider Ask your health care provider, or the department that is doing the test:  When will my results be ready?  How will I get my results?  What are my treatment options?  What other tests do I need?  What are my next steps? Summary  The glucose tolerance test (GTT) is one of several tests used to diagnose diabetes that develops during pregnancy (gestational diabetes mellitus). Gestational diabetes is a temporary form of diabetes that some women develop during pregnancy.  You may have the GTT test after having a 1-hour glucose screening test if the results from that test indicate that you may have gestational diabetes. You may also have this test if you have any symptoms or risk factors for gestational diabetes.  Talk with your health care provider about what your results mean. This information is not intended to replace advice given to you by your health care provider. Make sure you discuss any questions you have with your health care provider. Document Revised: 03/07/2019 Document Reviewed: 06/26/2017 Elsevier Patient Education  2020 Elsevier Inc.  

## 2020-04-29 NOTE — Progress Notes (Signed)
Patient Name: NEESHA LANGTON DOB: 08/10/1991 MRN: [id number] ULTRASOUND REPORT  Location: [facility] Date of Service: 04/29/2020   Indications: Anatomy follow up ultrasound Findings:  M[place] Jim intrauterine pregnancy is visualized with FHR at 161 BPM.  Fetal presentation is Cephalic.  Placenta: anterior. Grade: 1 AFI: subjectively normal.  Anatomic survey is complete.   There is no free peritoneal fluid in the cul de sac.  Impression: 1. [redacted]w[redacted]d Viable [place] Intrauterine pregnancy previously established criteria. 2. Normal Anatomy Scan is now complete  Recommendations: 1.Clinical correlation with the patient's History and Physical Exam.   Jenine M. Marciano Sequin    RDMS

## 2020-05-01 ENCOUNTER — Inpatient Hospital Stay: Payer: Medicare Other | Admitting: Internal Medicine

## 2020-05-01 ENCOUNTER — Inpatient Hospital Stay: Payer: Medicare Other

## 2020-05-03 LAB — CULTURE, OB URINE

## 2020-05-03 LAB — URINE CULTURE, OB REFLEX

## 2020-05-08 ENCOUNTER — Other Ambulatory Visit: Payer: Self-pay | Admitting: Certified Nurse Midwife

## 2020-05-08 MED ORDER — SULFAMETHOXAZOLE-TRIMETHOPRIM 800-160 MG PO TABS: 1.0000 | ORAL_TABLET | Freq: Two times a day (BID) | ORAL | 0 refills | Status: AC

## 2020-05-14 ENCOUNTER — Other Ambulatory Visit: Payer: Medicare Other

## 2020-05-14 ENCOUNTER — Encounter: Payer: Medicare Other | Admitting: Certified Nurse Midwife

## 2020-05-26 ENCOUNTER — Telehealth: Payer: Self-pay

## 2020-05-26 ENCOUNTER — Other Ambulatory Visit: Payer: Medicare Other

## 2020-05-26 ENCOUNTER — Encounter: Payer: Medicare Other | Admitting: Certified Nurse Midwife

## 2020-05-26 NOTE — Telephone Encounter (Signed)
Detailed voicemail message left for patient- stressed the importance of keeping her prenatal appointments. She has missed several appointments and she needs to have lab work done. Requested patient call the office as soon as she gets the message to reschedule this appointment.

## 2020-06-15 ENCOUNTER — Encounter: Payer: Medicare Other | Admitting: Certified Nurse Midwife

## 2020-06-16 ENCOUNTER — Other Ambulatory Visit: Payer: Self-pay | Admitting: Certified Nurse Midwife

## 2020-06-16 ENCOUNTER — Encounter: Payer: Self-pay | Admitting: Certified Nurse Midwife

## 2020-06-16 ENCOUNTER — Ambulatory Visit (INDEPENDENT_AMBULATORY_CARE_PROVIDER_SITE_OTHER): Payer: Medicare Other | Admitting: Certified Nurse Midwife

## 2020-06-16 ENCOUNTER — Other Ambulatory Visit: Payer: Self-pay

## 2020-06-16 VITALS — BP 101/43 | HR 67 | Wt 170.1 lb

## 2020-06-16 DIAGNOSIS — Z09 Encounter for follow-up examination after completed treatment for conditions other than malignant neoplasm: Secondary | ICD-10-CM

## 2020-06-16 DIAGNOSIS — Z3A33 33 weeks gestation of pregnancy: Secondary | ICD-10-CM

## 2020-06-16 LAB — POCT URINALYSIS DIPSTICK OB
Bilirubin, UA: NEGATIVE
Blood, UA: NEGATIVE
Glucose, UA: NEGATIVE
Ketones, UA: NEGATIVE
Leukocytes, UA: NEGATIVE
Nitrite, UA: NEGATIVE
POC,PROTEIN,UA: NEGATIVE
Spec Grav, UA: 1.02 (ref 1.010–1.025)
Urobilinogen, UA: 0.2 E.U./dL
pH, UA: 5 (ref 5.0–8.0)

## 2020-06-16 NOTE — Addendum Note (Signed)
Addended by: Brooke Dare on: 06/16/2020 10:45 AM   Modules accepted: Orders

## 2020-06-16 NOTE — Patient Instructions (Signed)
[facility] [place] [facility]  [facility]  [address], [place], [place] [address]  Phone: [phone]  [facility] (second location)  [address] [place], [place] [address]  Phone: [phone]  [facility] ([place]) [address], [place], [place] [address] Phone: [phone]  [facility]  [address] [place], [place] [address]  Phone: [phone] 

## 2020-06-16 NOTE — Progress Notes (Signed)
ROB doing well. Feels good fetal movement. State that she has not had her growth u/s or her 1 hr glucose screen because she had to cancel those appointments and was told by the front desk they were going to call her to reschedule and they never did. Discussed importance with pt of theses test pt state she will come back this week to have them done. She can not do it today because her kids are in the car.   Doreene Burke, CNM

## 2020-06-17 ENCOUNTER — Ambulatory Visit (INDEPENDENT_AMBULATORY_CARE_PROVIDER_SITE_OTHER): Payer: Medicare Other

## 2020-06-17 ENCOUNTER — Ambulatory Visit: Payer: Medicare Other

## 2020-06-17 ENCOUNTER — Other Ambulatory Visit: Payer: Self-pay

## 2020-06-17 DIAGNOSIS — Z3A33 33 weeks gestation of pregnancy: Secondary | ICD-10-CM | POA: Diagnosis not present

## 2020-06-17 DIAGNOSIS — O99012 Anemia complicating pregnancy, second trimester: Secondary | ICD-10-CM

## 2020-06-17 DIAGNOSIS — Z09 Encounter for follow-up examination after completed treatment for conditions other than malignant neoplasm: Secondary | ICD-10-CM

## 2020-06-17 DIAGNOSIS — Z130 Encounter for screening for diseases of the blood and blood-forming organs and certain disorders involving the immune mechanism: Secondary | ICD-10-CM

## 2020-06-17 DIAGNOSIS — Z113 Encounter for screening for infections with a predominantly sexual mode of transmission: Secondary | ICD-10-CM

## 2020-06-18 LAB — CBC
Hematocrit: 24 % — ABNORMAL LOW (ref 34.0–46.6)
Hemoglobin: 6.8 g/dL — CL (ref 11.1–15.9)
MCH: 19.5 pg — ABNORMAL LOW (ref 26.6–33.0)
MCHC: 28.3 g/dL — ABNORMAL LOW (ref 31.5–35.7)
MCV: 69 fL — ABNORMAL LOW (ref 79–97)
Platelets: 212 10*3/uL (ref 150–450)
RBC: 3.48 x10E6/uL — ABNORMAL LOW (ref 3.77–5.28)
RDW: 17.9 % — ABNORMAL HIGH (ref 11.7–15.4)
WBC: 7.1 10*3/uL (ref 3.4–10.8)

## 2020-06-18 LAB — GLUCOSE, 1 HOUR GESTATIONAL: Gestational Diabetes Screen: 118 mg/dL (ref 65–139)

## 2020-06-18 LAB — RPR: RPR Ser Ql: NONREACTIVE

## 2020-06-22 ENCOUNTER — Other Ambulatory Visit: Payer: Self-pay | Admitting: Certified Nurse Midwife

## 2020-06-22 DIAGNOSIS — O99013 Anemia complicating pregnancy, third trimester: Secondary | ICD-10-CM

## 2020-06-22 NOTE — Progress Notes (Signed)
Referral placed for hematology consult for anemia in pregnancy.   Doreene Burke, CNM

## 2020-06-30 ENCOUNTER — Other Ambulatory Visit: Payer: Self-pay | Admitting: *Deleted

## 2020-06-30 DIAGNOSIS — O99012 Anemia complicating pregnancy, second trimester: Secondary | ICD-10-CM

## 2020-06-30 DIAGNOSIS — O99013 Anemia complicating pregnancy, third trimester: Secondary | ICD-10-CM

## 2020-06-30 NOTE — Progress Notes (Signed)
Incoming referral from OBGYN. Patient has missed multiple appointments in the cancer center. apts made in cancer center for lab/md/IV venofer for the patient. Pt states that she will do her best to keep the appointments in the clinic tom per scheduling.

## 2020-07-01 ENCOUNTER — Inpatient Hospital Stay: Payer: Medicare Other | Attending: Internal Medicine

## 2020-07-01 ENCOUNTER — Inpatient Hospital Stay: Payer: Medicare Other | Admitting: Internal Medicine

## 2020-07-01 ENCOUNTER — Inpatient Hospital Stay: Payer: Medicare Other

## 2020-07-01 ENCOUNTER — Telehealth: Payer: Self-pay | Admitting: *Deleted

## 2020-07-01 NOTE — Telephone Encounter (Signed)
Left voice mail message for patient to return my phone. Unable to reach patient. I informed Nadeen Landau, CMA at [facility] that pt is no show for her apt.

## 2020-07-01 NOTE — Assessment & Plan Note (Deleted)
#  Severe anemia most recent hemoglobin 6.4; likely iron deficiency nutritional/pregnancy.  Patient was asymptomatic.  Recommend IV Venofer.  Discussed the potential acute infusion reactions with IV iron; which are quite rare.  Patient understands the risk; will proceed with infusions.  #Recommend checking CBC CMP LDH haptoglobin; hold tube type and cross.   Thank you Dr.Lawhorn for allowing me to participate in the care of your pleasant patient. Please do not hesitate to contact me with questions or concerns in the interim.  # DISPOSITION: # labs- today # 1 unit PRBC transfusion on 5/17. # Venofer- weekly x 4; start early next week # Follow up in 3 weeks- MD; labs- cbc; venofer- Dr.B  Addendum: Hemoglobin 5.6.  Iron studies severe iron deficiency.  Given the pregnancy/severe symptomatic disease/hemoglobin of 5.6 recommend urgent PRBC transfusion.  Discussed the patient recommend go to the emergency room.  Also discussed with patient's referring provider [name].  Recommend to insert PRBC transfusion; keep up appointments as planned next week.

## 2020-07-02 ENCOUNTER — Encounter: Payer: Medicare Other | Admitting: Obstetrics and Gynecology

## 2020-07-02 ENCOUNTER — Encounter: Payer: Medicare Other | Admitting: Certified Nurse Midwife

## 2020-07-07 ENCOUNTER — Telehealth: Payer: Self-pay

## 2020-07-07 NOTE — Telephone Encounter (Signed)
Attempted to contact patient- line picked up then cut out to dead air. If patient is afebrile she may come to the office to see one of the MDs for a preop visit that she missed per Doreene Burke CNM

## 2020-07-08 ENCOUNTER — Other Ambulatory Visit: Payer: Self-pay | Admitting: Certified Nurse Midwife

## 2020-07-08 MED ORDER — HYDROCORTISONE (PERIANAL) 2.5 % EX CREA: 1.0000 "application " | TOPICAL_CREAM | Freq: Two times a day (BID) | CUTANEOUS | 0 refills | Status: DC

## 2020-07-08 NOTE — Progress Notes (Signed)
Orders placed for hemorrhoids.   Doreene Burke, CNM

## 2020-07-09 ENCOUNTER — Encounter: Payer: Medicare Other | Admitting: Obstetrics and Gynecology

## 2020-07-15 ENCOUNTER — Encounter: Payer: Medicare Other | Admitting: Obstetrics and Gynecology

## 2020-07-16 ENCOUNTER — Other Ambulatory Visit: Payer: Self-pay

## 2020-07-16 ENCOUNTER — Encounter: Payer: Self-pay | Admitting: Obstetrics and Gynecology

## 2020-07-16 ENCOUNTER — Ambulatory Visit (INDEPENDENT_AMBULATORY_CARE_PROVIDER_SITE_OTHER): Payer: Medicare Other | Admitting: Obstetrics and Gynecology

## 2020-07-16 VITALS — BP 107/66 | HR 80 | Wt 170.8 lb

## 2020-07-16 DIAGNOSIS — O2313 Infections of bladder in pregnancy, third trimester: Secondary | ICD-10-CM | POA: Diagnosis not present

## 2020-07-16 DIAGNOSIS — N3001 Acute cystitis with hematuria: Secondary | ICD-10-CM

## 2020-07-16 DIAGNOSIS — Z3403 Encounter for supervision of normal first pregnancy, third trimester: Secondary | ICD-10-CM

## 2020-07-16 DIAGNOSIS — Z01818 Encounter for other preprocedural examination: Secondary | ICD-10-CM

## 2020-07-16 DIAGNOSIS — Z3A37 37 weeks gestation of pregnancy: Secondary | ICD-10-CM | POA: Diagnosis not present

## 2020-07-16 LAB — POCT URINALYSIS DIPSTICK OB
Bilirubin, UA: NEGATIVE
Glucose, UA: NEGATIVE
Ketones, UA: NEGATIVE
Leukocytes, UA: NEGATIVE
Nitrite, UA: POSITIVE
Spec Grav, UA: 1.01 (ref 1.010–1.025)
Urobilinogen, UA: 0.2 E.U./dL
pH, UA: 6 (ref 5.0–8.0)

## 2020-07-16 MED ORDER — NITROFURANTOIN MONOHYD MACRO 100 MG PO CAPS: 100.0000 mg | ORAL_CAPSULE | Freq: Two times a day (BID) | ORAL | 1 refills | Status: DC

## 2020-07-16 NOTE — Addendum Note (Signed)
Addended by: Dorian Pod on: 07/16/2020 11:17 AM   Modules accepted: Orders

## 2020-07-16 NOTE — Progress Notes (Signed)
ROB/Pre-op: Patient presents today for her preop cesarean delivery visit.  She desires repeat cesarean delivery.  Patient complains of urinary symptoms and urine dip consistent with UTI.  Macrobid prescribed.  We discussed the procedure of cesarean delivery in detail.  All questions answered.  Risk benefits discussed.  Covid testing prior, n.p.o., postop pain control in light of Subutex use discussed.  Patient to discuss admission for anemia and possible transfusion prior to cesarean with [name] at next visit. (Thanks [place])

## 2020-07-17 ENCOUNTER — Telehealth: Payer: Self-pay | Admitting: Certified Nurse Midwife

## 2020-07-17 ENCOUNTER — Other Ambulatory Visit: Payer: Self-pay | Admitting: Certified Nurse Midwife

## 2020-07-17 DIAGNOSIS — Z98891 History of uterine scar from previous surgery: Secondary | ICD-10-CM

## 2020-07-17 DIAGNOSIS — Z3A37 37 weeks gestation of pregnancy: Secondary | ICD-10-CM

## 2020-07-17 DIAGNOSIS — F1120 Opioid dependence, uncomplicated: Secondary | ICD-10-CM

## 2020-07-17 DIAGNOSIS — O288 Other abnormal findings on antenatal screening of mother: Secondary | ICD-10-CM

## 2020-07-17 DIAGNOSIS — O0930 Supervision of pregnancy with insufficient antenatal care, unspecified trimester: Secondary | ICD-10-CM

## 2020-07-17 DIAGNOSIS — O0933 Supervision of pregnancy with insufficient antenatal care, third trimester: Secondary | ICD-10-CM

## 2020-07-17 DIAGNOSIS — O99320 Drug use complicating pregnancy, unspecified trimester: Secondary | ICD-10-CM

## 2020-07-17 NOTE — Telephone Encounter (Signed)
Called pt and informed her that Renee [name] needs her to come in for an u/s this coming week. The pt understood and we made the appt

## 2020-07-17 NOTE — Progress Notes (Signed)
Order placed for growth/AFI, see chart.    Renee [name], CNM [facility], [facility] 07/17/20 1:43 PM

## 2020-07-18 LAB — STREP GP B NAA+RFLX: Strep Gp B NAA+Rflx: NEGATIVE

## 2020-07-18 LAB — GC/CHLAMYDIA PROBE AMP
Chlamydia trachomatis, NAA: NEGATIVE
Neisseria Gonorrhoeae by PCR: NEGATIVE

## 2020-07-21 ENCOUNTER — Other Ambulatory Visit: Payer: Medicare Other

## 2020-07-22 ENCOUNTER — Ambulatory Visit (INDEPENDENT_AMBULATORY_CARE_PROVIDER_SITE_OTHER): Payer: Medicare Other

## 2020-07-22 ENCOUNTER — Other Ambulatory Visit: Payer: Self-pay

## 2020-07-22 DIAGNOSIS — Z3A37 37 weeks gestation of pregnancy: Secondary | ICD-10-CM | POA: Diagnosis not present

## 2020-07-22 DIAGNOSIS — O99320 Drug use complicating pregnancy, unspecified trimester: Secondary | ICD-10-CM

## 2020-07-22 DIAGNOSIS — Z98891 History of uterine scar from previous surgery: Secondary | ICD-10-CM | POA: Diagnosis not present

## 2020-07-22 DIAGNOSIS — O0930 Supervision of pregnancy with insufficient antenatal care, unspecified trimester: Secondary | ICD-10-CM | POA: Diagnosis not present

## 2020-07-22 DIAGNOSIS — O288 Other abnormal findings on antenatal screening of mother: Secondary | ICD-10-CM

## 2020-07-22 DIAGNOSIS — F1120 Opioid dependence, uncomplicated: Secondary | ICD-10-CM

## 2020-07-22 DIAGNOSIS — O0933 Supervision of pregnancy with insufficient antenatal care, third trimester: Secondary | ICD-10-CM

## 2020-07-23 ENCOUNTER — Encounter
Admission: RE | Admit: 2020-07-23 | Discharge: 2020-07-23 | Disposition: A | Discharge status: Home / Self Care | Payer: Medicare Other | Source: Ambulatory Visit | Attending: Obstetrics and Gynecology | Admitting: Obstetrics and Gynecology

## 2020-07-23 HISTORY — DX: Anemia, unspecified: D64.9

## 2020-07-23 NOTE — Addendum Note (Signed)
Addended by: Elonda Husky on: 07/23/2020 09:57 AM   Modules accepted: Orders, SmartSet

## 2020-07-23 NOTE — Patient Instructions (Signed)
Your procedure is scheduled on: Monday July 27, 2020. Report to Day Surgery. To find out your arrival time please call [phone] between 1PM - 3PM on Friday July 24, 2020.  Remember: Instructions that are not followed completely may result in serious medical risk,  up to and including death, or upon the discretion of your surgeon and anesthesiologist your  surgery may need to be rescheduled.     _X__ 1. Do not eat food after midnight the night before your procedure.                 No chewing gum or hard candies. You may drink clear liquids up to 2 hours                 before you are scheduled to arrive for your surgery- DO not drink clear                 liquids within 2 hours of the start of your surgery.                 Clear Liquids include:  water, apple juice without pulp, clear Gatorade, G2 or                  Gatorade Zero (avoid Red/Purple/Blue), Black Coffee or Tea (Do not add                 anything to coffee or tea).  __X__2. Complete the "Ensure Clear Pre-surgery Clear Carbohydrate Drink" provided to you, 2 hours before arrival. **If you are diabetic you will be  provided with an alternative drink, Gatorade Zero or G2.  __X__3.  On the morning of surgery brush your teeth with toothpaste and water, you                may rinse your mouth with mouthwash if you wish.  Do not swallow any toothpaste of mouthwash.     _X__ 4.  No Alcohol for 24 hours before or after surgery.   _X__ 5.  Do Not Smoke or use e-cigarettes For 24 Hours Prior to Your Surgery.                 Do not use any chewable tobacco products for at least 6 hours prior to                 Surgery.  _X__  6.  Do not use any recreational drugs (marijuana, cocaine, heroin, ecstasy, MDMA or other)                For at least one week prior to your surgery.  Combination of these drugs with anesthesia                May have life threatening results.  __X__  7.  Notify your doctor if  there is any change in your medical condition      (cold, fever, infections).     Do not wear jewelry, make-up, hairpins, clips or nail polish. Do not wear lotions, powders, or perfumes. You may wear deodorant. Do not shave 48 hours prior to surgery. Men may shave face and neck. Do not bring valuables to the hospital.    [facility] is not responsible for any belongings or valuables.  Contacts, dentures or bridgework may not be worn into surgery. Leave your suitcase in the car. After surgery it may be brought to your room. For patients admitted to the hospital, discharge  time is determined by your treatment team.   Patients discharged the day of surgery will not be allowed to drive home.   Make arrangements for someone to be with you for the first 24 hours of your Same Day Discharge.   __X__ Take these medicines the morning of surgery with A SIP OF WATER:    1. buprenorphine (SUBUTEX) 8 MG   __X__ Use CHG Soap (or wipes) as directed  __X__ Stop Anti-inflammatories such as Ibuprofen, Aleve, Advil, naproxen, aspirin and or BC powders.    __X__ Stop supplements until after surgery.    __X__ Do not start any herbal supplements before your surgery.    If you have any questions regarding your pre-procedure instructions,  Please call Pre-admit Testing at [phone].

## 2020-07-24 ENCOUNTER — Ambulatory Visit (INDEPENDENT_AMBULATORY_CARE_PROVIDER_SITE_OTHER): Payer: Medicare Other | Admitting: Certified Nurse Midwife

## 2020-07-24 ENCOUNTER — Other Ambulatory Visit
Admission: RE | Admit: 2020-07-24 | Discharge: 2020-07-24 | Disposition: A | Discharge status: Home / Self Care | Payer: Medicare Other | Source: Ambulatory Visit | Attending: Obstetrics and Gynecology | Admitting: Obstetrics and Gynecology

## 2020-07-24 ENCOUNTER — Other Ambulatory Visit: Payer: Self-pay

## 2020-07-24 ENCOUNTER — Encounter: Payer: Self-pay | Admitting: Certified Nurse Midwife

## 2020-07-24 VITALS — BP 97/46 | HR 83 | Wt 173.0 lb

## 2020-07-24 DIAGNOSIS — O34211 Maternal care for low transverse scar from previous cesarean delivery: Secondary | ICD-10-CM | POA: Diagnosis not present

## 2020-07-24 DIAGNOSIS — O99320 Drug use complicating pregnancy, unspecified trimester: Secondary | ICD-10-CM

## 2020-07-24 DIAGNOSIS — F1120 Opioid dependence, uncomplicated: Secondary | ICD-10-CM

## 2020-07-24 DIAGNOSIS — Z20822 Contact with and (suspected) exposure to covid-19: Secondary | ICD-10-CM | POA: Insufficient documentation

## 2020-07-24 DIAGNOSIS — Z98891 History of uterine scar from previous surgery: Secondary | ICD-10-CM

## 2020-07-24 DIAGNOSIS — O0930 Supervision of pregnancy with insufficient antenatal care, unspecified trimester: Secondary | ICD-10-CM

## 2020-07-24 DIAGNOSIS — Z01812 Encounter for preprocedural laboratory examination: Secondary | ICD-10-CM | POA: Insufficient documentation

## 2020-07-24 DIAGNOSIS — Z3A38 38 weeks gestation of pregnancy: Secondary | ICD-10-CM

## 2020-07-24 DIAGNOSIS — Z3483 Encounter for supervision of other normal pregnancy, third trimester: Secondary | ICD-10-CM

## 2020-07-24 LAB — POCT URINALYSIS DIPSTICK OB
Bilirubin, UA: NEGATIVE
Blood, UA: NEGATIVE
Glucose, UA: NEGATIVE
Ketones, UA: NEGATIVE
Leukocytes, UA: NEGATIVE
Nitrite, UA: NEGATIVE
Spec Grav, UA: >=1.03 — AB (ref 1.010–1.025)
Urobilinogen, UA: 0.2 E.U./dL
pH, UA: 6 (ref 5.0–8.0)

## 2020-07-24 LAB — CBC
HCT: 21.6 % — ABNORMAL LOW (ref 36.0–46.0)
Hemoglobin: 6.2 g/dL — ABNORMAL LOW (ref 12.0–15.0)
MCH: 18.9 pg — ABNORMAL LOW (ref 26.0–34.0)
MCHC: 28.7 g/dL — ABNORMAL LOW (ref 30.0–36.0)
MCV: 65.9 fL — ABNORMAL LOW (ref 80.0–100.0)
Platelets: 256 10*3/uL (ref 150–400)
RBC: 3.28 MIL/uL — ABNORMAL LOW (ref 3.87–5.11)
RDW: 17.2 % — ABNORMAL HIGH (ref 11.5–15.5)
WBC: 8.1 10*3/uL (ref 4.0–10.5)
nRBC: 0 % (ref 0.0–0.2)

## 2020-07-24 LAB — SARS CORONAVIRUS 2 (TAT 6-24 HRS): SARS Coronavirus 2: NEGATIVE

## 2020-07-24 NOTE — Progress Notes (Signed)
ROB-Doing well. Discussed plan for Sunday admission and blood transfusion prior to cesarean section and bilateral tubal ligation on Monday. Findings of growth ultrasound discussed with patient, see below. Reviewed red flag symptoms and when to call. Heading to Pre-Admission COVID testing immediately following today's visit. RTC x 2 weeks for incision check or sooner if needed.   ULTRASOUND REPORT  Location: [facility] Date of Service: 07/22/2020   Indications:growth/afi   Findings:  Renee [name] intrauterine pregnancy is visualized with FHR at 170 BPM.Fetal presentation is Cephalic.  Placenta: anterior. Grade: 3 AFI: 10.5 cm  Growth percentile is 24. EFW: 2954 g ( 6 lbs 8 oz)   Impression: 1. [redacted]w[redacted]d Viable Singleton Intrauterine pregnancy previously established criteria. 2. Growth is 24 %ile.  AFI is 10.5 cm.   Recommendations: 1.Clinical correlation with the patient's History and Physical Exam.

## 2020-07-24 NOTE — Patient Instructions (Signed)
Cesarean Delivery Cesarean birth, or cesarean delivery, is the surgical delivery of a baby through an incision in the abdomen and the uterus. This may be referred to as a C-section. This procedure may be scheduled ahead of time, or it may be done in an emergency situation. Tell a health care provider about:  Any allergies you have.  All medicines you are taking, including vitamins, herbs, eye drops, creams, and over-the-counter medicines.  Any problems you or family members have had with anesthetic medicines.  Any blood disorders you have.  Any surgeries you have had.  Any medical conditions you have.  Whether you or any members of your family have a history of deep vein thrombosis (DVT) or pulmonary embolism (PE). What are the risks? Generally, this is a safe procedure. However, problems may occur, including:  Infection.  Bleeding.  Allergic reactions to medicines.  Damage to other structures or organs.  Blood clots.  Injury to your baby. What happens before the procedure? General instructions  Follow instructions from your health care provider about eating or drinking restrictions.  If you know that you are going to have a cesarean delivery, do not shave your pubic area. Shaving before the procedure may increase your risk of infection.  Plan to have someone take you home from the hospital.  Ask your health care provider what steps will be taken to prevent infection. These may include: ? Removing hair at the surgery site. ? Washing skin with a germ-killing soap. ? Taking antibiotic medicine.  Depending on the reason for your cesarean delivery, you may have a physical exam or additional testing, such as an ultrasound.  You may have your blood or urine tested. Questions for your health care provider  Ask your health care provider about: ? Changing or stopping your regular medicines. This is especially important if you are taking diabetes medicines or blood  thinners. ? Your pain management plan. This is especially important if you plan to breastfeed your baby. ? How long you will be in the hospital after the procedure. ? Any concerns you may have about receiving blood products, if you need them during the procedure. ? Cord blood banking, if you plan to collect your baby's umbilical cord blood.  You may also want to ask your health care provider: ? Whether you will be able to hold or breastfeed your baby while you are still in the operating room. ? Whether your baby can stay with you immediately after the procedure and during your recovery. ? Whether a family member or a person of your choice can go with you into the operating room and stay with you during the procedure, immediately after the procedure, and during your recovery. What happens during the procedure?   An IV will be inserted into one of your veins.  Fluid and medicines, such as antibiotics, will be given before the surgery.  Fetal monitors will be placed on your abdomen to check your baby's heart rate.  You may be given a special warming gown to wear to keep your temperature stable.  A catheter may be inserted into your bladder through your urethra. This drains your urine during the procedure.  You may be given one or more of the following: ? A medicine to numb the area (local anesthetic). ? A medicine to make you fall asleep (general anesthetic). ? A medicine (regional anesthetic) that is injected into your back or through a small thin tube placed in your back (spinal anesthetic or epidural anesthetic).   This numbs everything below the injection site and allows you to stay awake during your procedure. If this makes you feel nauseous, tell your health care provider. Medicines will be available to help reduce any nausea you may feel.  An incision will be made in your abdomen, and then in your uterus.  If you are awake during your procedure, you may feel tugging and pulling in  your abdomen, but you should not feel pain. If you feel pain, tell your health care provider immediately.  Your baby will be removed from your uterus. You may feel more pressure or pushing while this happens.  Immediately after birth, your baby will be dried and kept warm. You may be able to hold and breastfeed your baby.  The umbilical cord may be clamped and cut during this time. This usually occurs after waiting a period of 1-2 minutes after delivery.  Your placenta will be removed from your uterus.  Your incisions will be closed with stitches (sutures). Staples, skin glue, or adhesive strips may also be applied to the incision in your abdomen.  Bandages (dressings) may be placed over the incision in your abdomen. The procedure may vary among health care providers and hospitals. What happens after the procedure?  Your blood pressure, heart rate, breathing rate, and blood oxygen level will be monitored until you are discharged from the hospital.  You may continue to receive fluids and medicines through an IV.  You will have some pain. Medicines will be available to help control your pain.  To help prevent blood clots: ? You may be given medicines. ? You may have to wear compression stockings or devices. ? You will be encouraged to walk around when you are able.  Hospital staff will encourage and support bonding with your baby. Your hospital may have you and your baby to stay in the same room (rooming in) during your hospital stay to encourage successful bonding and breastfeeding.  You may be encouraged to cough and breathe deeply often. This helps to prevent lung problems.  If you have a catheter draining your urine, it will be removed as soon as possible after your procedure. Summary  Cesarean birth, or cesarean delivery, is the surgical delivery of a baby through an incision in the abdomen and the uterus.  Follow instructions from your health care provider about eating or  drinking restrictions before the procedure.  You will have some pain after the procedure. Medicines will be available to help control your pain.  Hospital staff will encourage and support bonding with your baby after the procedure. Your hospital may have you and your baby to stay in the same room (rooming in) during your hospital stay to encourage successful bonding and breastfeeding. This information is not intended to replace advice given to you by your health care provider. Make sure you discuss any questions you have with your health care provider. Document Revised: 05/21/2018 Document Reviewed: 05/21/2018 Elsevier Patient Education  2020 [employer]   Postpartum Care After Cesarean Delivery This sheet gives you information about how to care for yourself from the time you deliver your baby to up to 6-12 weeks after delivery (postpartum period). Your health care provider may also give you more specific instructions. If you have problems or questions, contact your health care provider. Follow these instructions at home: Medicines  Take over-the-counter and prescription medicines only as told by your health care provider.  If you were prescribed an antibiotic medicine, take it as told by your  health care provider. Do not stop taking the antibiotic even if you start to feel better.  Ask your health care provider if the medicine prescribed to you: ? Requires you to avoid driving or using heavy machinery. ? Can cause constipation. You may need to take actions to prevent or treat constipation, such as:  Drink enough fluid to keep your urine pale yellow.  Take over-the-counter or prescription medicines.  Eat foods that are high in fiber, such as beans, whole grains, and fresh fruits and vegetables.  Limit foods that are high in fat and processed sugars, such as fried or sweet foods. Activity  Gradually return to your normal activities as told by your health care provider.  Avoid  activities that take a lot of effort and energy (are strenuous) until approved by your health care provider. Walking at a slow to moderate pace is usually safe. Ask your health care provider what activities are safe for you. ? Do not lift anything that is heavier than your baby or 10 lb (4.5 kg) as told by your health care provider. ? Do not vacuum, climb stairs, or drive a car for as long as told by your health care provider.  If possible, have someone help you at home until you are able to do your usual activities yourself.  Rest as much as possible. Try to rest or take naps while your baby is sleeping. Vaginal bleeding  It is normal to have vaginal bleeding (lochia) after delivery. Wear a sanitary pad to absorb vaginal bleeding and discharge. ? During the first week after delivery, the amount and appearance of lochia is often similar to a menstrual period. ? Over the next few weeks, it will gradually decrease to a dry, yellow-brown discharge. ? For most women, lochia stops completely by 4-6 weeks after delivery. Vaginal bleeding can vary from woman to woman.  Change your sanitary pads frequently. Watch for any changes in your flow, such as: ? A sudden increase in volume. ? A change in color. ? Large blood clots.  If you pass a blood clot, save it and call your health care provider to discuss. Do not flush blood clots down the toilet before you get instructions from your health care provider.  Do not use tampons or douches until your health care provider says this is safe.  If you are not breastfeeding, your period should return 6-8 weeks after delivery. If you are breastfeeding, your period may return anytime between 8 weeks after delivery and the time that you stop breastfeeding. Perineal care   If your C-section (Cesarean section) was unplanned, and you were allowed to labor and push before delivery, you may have pain, swelling, and discomfort of the tissue between your vaginal  opening and your anus (perineum). You may also have an incision in the tissue (episiotomy) or the tissue may have torn during delivery. Follow these instructions as told by your health care provider: ? Keep your perineum clean and dry as told by your health care provider. Use medicated pads and pain-relieving sprays and creams as directed. ? If you have an episiotomy or vaginal tear, check the area every day for signs of infection. Check for:  Redness, swelling, or pain.  Fluid or blood.  Warmth.  Pus or a bad smell. ? You may be given a squirt bottle to use instead of wiping to clean the perineum area after you go to the bathroom. As you start healing, you may use the squirt bottle before wiping  yourself. Make sure to wipe gently. ? To relieve pain caused by an episiotomy, vaginal tear, or hemorrhoids, try taking a warm sitz bath 2-3 times a day. A sitz bath is a warm water bath that is taken while you are sitting down. The water should only come up to your hips and should cover your buttocks. Breast care  Within the first few days after delivery, your breasts may feel heavy, full, and uncomfortable (breast engorgement). You may also have milk leaking from your breasts. Your health care provider can suggest ways to help relieve breast discomfort. Breast engorgement should go away within a few days.  If you are breastfeeding: ? Wear a bra that supports your breasts and fits you well. ? Keep your nipples clean and dry. Apply creams and ointments as told by your health care provider. ? You may need to use breast pads to absorb milk leakage. ? You may have uterine contractions every time you breastfeed for several weeks after delivery. Uterine contractions help your uterus return to its normal size. ? If you have any problems with breastfeeding, work with your health care provider or a Advertising copywriter.  If you are not breastfeeding: ? Avoid touching your breasts as this can make your  breasts produce more milk. ? Wear a well-fitting bra and use cold packs to help with swelling. ? Do not squeeze out (express) milk. This causes you to make more milk. Intimacy and sexuality  Ask your health care provider when you can engage in sexual activity. This may depend on your: ? Risk of infection. ? Healing rate. ? Comfort and desire to engage in sexual activity.  You are able to get pregnant after delivery, even if you have not had your period. If desired, talk with your health care provider about methods of family planning or birth control (contraception). Lifestyle  Do not use any products that contain nicotine or tobacco, such as cigarettes, e-cigarettes, and chewing tobacco. If you need help quitting, ask your health care provider.  Do not drink alcohol, especially if you are breastfeeding. Eating and drinking   Drink enough fluid to keep your urine pale yellow.  Eat high-fiber foods every day. These may help prevent or relieve constipation. High-fiber foods include: ? Whole grain cereals and breads. ? Brown rice. ? Beans. ? Fresh fruits and vegetables.  Take your prenatal vitamins until your postpartum checkup or until your health care provider tells you it is okay to stop. General instructions  Keep all follow-up visits for you and your baby as told by your health care provider. Most women visit their health care provider for a postpartum checkup within the first 3-6 weeks after delivery. Contact a health care provider if you:  Feel unable to cope with the changes that a new baby brings to your life, and these feelings do not go away.  Feel unusually sad or worried.  Have breasts that are painful, hard, or turn red.  Have a fever.  Have trouble holding urine or keeping urine from leaking.  Have little or no interest in activities you used to enjoy.  Have not breastfed at all and you have not had a menstrual period for 12 weeks after delivery.  Have  stopped breastfeeding and you have not had a menstrual period for 12 weeks after you stopped breastfeeding.  Have questions about caring for yourself or your baby.  Pass a blood clot from your vagina. Get help right away if you:  Have chest pain.  Have difficulty breathing.  Have sudden, severe leg pain.  Have severe pain or cramping in your abdomen.  Bleed from your vagina so much that you fill more than one sanitary pad in one hour. Bleeding should not be heavier than your heaviest period.  Develop a severe headache.  Faint.  Have blurred vision or spots in your vision.  Have a bad-smelling vaginal discharge.  Have thoughts about hurting yourself or your baby. If you ever feel like you may hurt yourself or others, or have thoughts about taking your own life, get help right away. You can go to your nearest emergency department or call:  Your local emergency services (911 in the [place]  A suicide crisis helpline, such as the National Suicide Prevention Lifeline at (216)425-3996. This is open 24 hours a day. Summary  The period of time from when you deliver your baby to up to 6-12 weeks after delivery is called the postpartum period.  Gradually return to your normal activities as told by your health care provider.  Keep all follow-up visits for you and your baby as told by your health care provider. This information is not intended to replace advice given to you by your health care provider. Make sure you discuss any questions you have with your health care provider. Document Revised: 07/04/2018 Document Reviewed: 07/04/2018 Elsevier Patient Education  2020 [employer].

## 2020-07-24 NOTE — Progress Notes (Signed)
  [facility] Perioperative Services: Pre-Admission/Anesthesia Testing  Abnormal Lab Notification    Date: 07/24/20  Name: Renee [name] MRN:   [id number]  Re: Abnormal labs noted during PAT appointment   Provider(s) Notified: Linzie Collin, MD Notification mode: Routed and/or faxed via [facility]   ABNORMAL LAB VALUE(S): Lab Results  Component Value Date   WBC 8.1 07/24/2020   HGB 6.2 (L) 07/24/2020   HCT 21.6 (L) 07/24/2020   MCV 65.9 (L) 07/24/2020   PLT 256 07/24/2020   Notes: Patient scheduled for a cesarean section on 07/27/2020. 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  Quentin Mulling, MSN, APRN, FNP-C, CEN [facility]  Peri-operative Services Nurse Practitioner Phone: [phone] 07/24/20 11:50 AM

## 2020-07-26 ENCOUNTER — Other Ambulatory Visit: Payer: Self-pay

## 2020-07-26 ENCOUNTER — Inpatient Hospital Stay
Admission: EM | Admit: 2020-07-26 | Discharge: 2020-07-30 | DRG: 784 | Disposition: A | Discharge status: Home / Self Care | Payer: Medicare Other | Attending: Obstetrics and Gynecology | Admitting: Obstetrics and Gynecology

## 2020-07-26 ENCOUNTER — Encounter: Payer: Self-pay | Admitting: Obstetrics and Gynecology

## 2020-07-26 DIAGNOSIS — D509 Iron deficiency anemia, unspecified: Secondary | ICD-10-CM | POA: Diagnosis present

## 2020-07-26 DIAGNOSIS — Z302 Encounter for sterilization: Secondary | ICD-10-CM

## 2020-07-26 DIAGNOSIS — O34211 Maternal care for low transverse scar from previous cesarean delivery: Principal | ICD-10-CM | POA: Diagnosis present

## 2020-07-26 DIAGNOSIS — O99324 Drug use complicating childbirth: Secondary | ICD-10-CM | POA: Diagnosis present

## 2020-07-26 DIAGNOSIS — O9902 Anemia complicating childbirth: Secondary | ICD-10-CM | POA: Diagnosis present

## 2020-07-26 DIAGNOSIS — Z01812 Encounter for preprocedural laboratory examination: Secondary | ICD-10-CM

## 2020-07-26 DIAGNOSIS — Z3A39 39 weeks gestation of pregnancy: Secondary | ICD-10-CM

## 2020-07-26 DIAGNOSIS — Z20822 Contact with and (suspected) exposure to covid-19: Secondary | ICD-10-CM | POA: Diagnosis present

## 2020-07-26 LAB — HEMOGLOBIN AND HEMATOCRIT, BLOOD
HCT: 23.4 % — ABNORMAL LOW (ref 36.0–46.0)
Hemoglobin: 6.6 g/dL — ABNORMAL LOW (ref 12.0–15.0)

## 2020-07-26 LAB — PREPARE RBC (CROSSMATCH)

## 2020-07-26 MED ORDER — POVIDONE-IODINE 10 % EX SWAB: 2.0000 "application " | Freq: Once | CUTANEOUS | Status: DC

## 2020-07-26 MED ORDER — SODIUM CHLORIDE 0.9% IV SOLUTION: Freq: Once | INTRAVENOUS | Status: AC

## 2020-07-26 MED ORDER — CEFAZOLIN SODIUM-DEXTROSE 2-4 GM/100ML-% IV SOLN
2.0000 g | INTRAVENOUS | Status: AC
  Administered 2020-07-27: 2 g via INTRAVENOUS
  Filled 2020-07-26 (×2): qty 100

## 2020-07-26 MED ORDER — LACTATED RINGERS IV SOLN: INTRAVENOUS | Status: DC

## 2020-07-27 ENCOUNTER — Encounter: Payer: Self-pay | Admitting: Obstetrics and Gynecology

## 2020-07-27 ENCOUNTER — Inpatient Hospital Stay
Admission: RE | Admit: 2020-07-27 | Payer: Medicare Other | Source: Home / Self Care | Admitting: Obstetrics and Gynecology

## 2020-07-27 ENCOUNTER — Encounter
Admission: EM | Disposition: A | Discharge status: Home / Self Care | Payer: Self-pay | Source: Home / Self Care | Attending: Obstetrics and Gynecology

## 2020-07-27 ENCOUNTER — Inpatient Hospital Stay: Payer: Medicare Other | Admitting: Registered Nurse

## 2020-07-27 DIAGNOSIS — D509 Iron deficiency anemia, unspecified: Secondary | ICD-10-CM | POA: Diagnosis present

## 2020-07-27 DIAGNOSIS — F558 Abuse of other non-psychoactive substances: Secondary | ICD-10-CM | POA: Diagnosis not present

## 2020-07-27 DIAGNOSIS — Z302 Encounter for sterilization: Secondary | ICD-10-CM

## 2020-07-27 DIAGNOSIS — Z01812 Encounter for preprocedural laboratory examination: Secondary | ICD-10-CM | POA: Diagnosis not present

## 2020-07-27 DIAGNOSIS — O99343 Other mental disorders complicating pregnancy, third trimester: Secondary | ICD-10-CM | POA: Diagnosis not present

## 2020-07-27 DIAGNOSIS — Z3A39 39 weeks gestation of pregnancy: Secondary | ICD-10-CM | POA: Diagnosis not present

## 2020-07-27 DIAGNOSIS — O99324 Drug use complicating childbirth: Secondary | ICD-10-CM | POA: Diagnosis present

## 2020-07-27 DIAGNOSIS — Z20822 Contact with and (suspected) exposure to covid-19: Secondary | ICD-10-CM | POA: Diagnosis present

## 2020-07-27 DIAGNOSIS — Z79891 Long term (current) use of opiate analgesic: Secondary | ICD-10-CM

## 2020-07-27 DIAGNOSIS — O34211 Maternal care for low transverse scar from previous cesarean delivery: Secondary | ICD-10-CM | POA: Diagnosis present

## 2020-07-27 DIAGNOSIS — O9902 Anemia complicating childbirth: Secondary | ICD-10-CM | POA: Diagnosis present

## 2020-07-27 LAB — CBC WITH DIFFERENTIAL/PLATELET
Abs Immature Granulocytes: 0.05 10*3/uL (ref 0.00–0.07)
Basophils Absolute: 0 10*3/uL (ref 0.0–0.1)
Basophils Relative: 0 %
Eosinophils Absolute: 0 10*3/uL (ref 0.0–0.5)
Eosinophils Relative: 1 %
HCT: 27.3 % — ABNORMAL LOW (ref 36.0–46.0)
Hemoglobin: 8.1 g/dL — ABNORMAL LOW (ref 12.0–15.0)
Immature Granulocytes: 1 %
Lymphocytes Relative: 30 %
Lymphs Abs: 2.2 10*3/uL (ref 0.7–4.0)
MCH: 20.6 pg — ABNORMAL LOW (ref 26.0–34.0)
MCHC: 29.7 g/dL — ABNORMAL LOW (ref 30.0–36.0)
MCV: 69.5 fL — ABNORMAL LOW (ref 80.0–100.0)
Monocytes Absolute: 0.6 10*3/uL (ref 0.1–1.0)
Monocytes Relative: 8 %
Neutro Abs: 4.5 10*3/uL (ref 1.7–7.7)
Neutrophils Relative %: 60 %
Platelets: 213 10*3/uL (ref 150–400)
RBC: 3.93 MIL/uL (ref 3.87–5.11)
RDW: 19.9 % — ABNORMAL HIGH (ref 11.5–15.5)
Smear Review: NORMAL
WBC: 7.4 10*3/uL (ref 4.0–10.5)
nRBC: 0 % (ref 0.0–0.2)

## 2020-07-27 LAB — RPR: RPR Ser Ql: NONREACTIVE

## 2020-07-27 SURGERY — Surgical Case
Anesthesia: Spinal

## 2020-07-27 MED ORDER — LIDOCAINE HCL (PF) 1 % IJ SOLN
INTRAMUSCULAR | Status: DC | PRN
  Administered 2020-07-27: 3 mL via SUBCUTANEOUS

## 2020-07-27 MED ORDER — DIPHENHYDRAMINE HCL 25 MG PO CAPS: 25.0000 mg | ORAL_CAPSULE | Freq: Four times a day (QID) | ORAL | Status: DC | PRN

## 2020-07-27 MED ORDER — ONDANSETRON HCL 4 MG/2ML IJ SOLN: 4.0000 mg | Freq: Three times a day (TID) | INTRAMUSCULAR | Status: DC | PRN

## 2020-07-27 MED ORDER — MORPHINE SULFATE (PF) 0.5 MG/ML IJ SOLN
INTRAMUSCULAR | Status: DC | PRN
  Administered 2020-07-27: .1 mg via INTRATHECAL

## 2020-07-27 MED ORDER — NALOXONE HCL 4 MG/10ML IJ SOLN
1.0000 ug/kg/h | INTRAVENOUS | Status: DC | PRN
  Filled 2020-07-27: qty 5

## 2020-07-27 MED ORDER — ZOLPIDEM TARTRATE 5 MG PO TABS: 5.0000 mg | ORAL_TABLET | Freq: Every evening | ORAL | Status: DC | PRN

## 2020-07-27 MED ORDER — NON FORMULARY
8.0000 mg | Freq: Two times a day (BID) | Status: DC
Start: 2020-07-27 — End: 2020-07-27

## 2020-07-27 MED ORDER — SCOPOLAMINE 1 MG/3DAYS TD PT72
1.0000 | MEDICATED_PATCH | Freq: Once | TRANSDERMAL | Status: AC
  Administered 2020-07-27: 1.5 mg via TRANSDERMAL
  Filled 2020-07-27: qty 1

## 2020-07-27 MED ORDER — EPHEDRINE SULFATE-NACL 50-0.9 MG/10ML-% IV SOSY
PREFILLED_SYRINGE | INTRAVENOUS | Status: DC | PRN
  Administered 2020-07-27 (×3): 5 mg via INTRAVENOUS

## 2020-07-27 MED ORDER — ACETAMINOPHEN 10 MG/ML IV SOLN
INTRAVENOUS | Status: AC
  Filled 2020-07-27: qty 100

## 2020-07-27 MED ORDER — NALBUPHINE HCL 10 MG/ML IJ SOLN: 5.0000 mg | Freq: Once | INTRAMUSCULAR | Status: DC | PRN

## 2020-07-27 MED ORDER — OXYTOCIN-SODIUM CHLORIDE 30-0.9 UT/500ML-% IV SOLN: 2.5000 [IU]/h | INTRAVENOUS | Status: AC

## 2020-07-27 MED ORDER — ONDANSETRON HCL 4 MG/2ML IJ SOLN
INTRAMUSCULAR | Status: DC | PRN
  Administered 2020-07-27: 4 mg via INTRAVENOUS

## 2020-07-27 MED ORDER — OXYCODONE HCL 5 MG PO TABS
5.0000 mg | ORAL_TABLET | ORAL | Status: DC | PRN
  Administered 2020-07-27 – 2020-07-29 (×4): 5 mg via ORAL
  Filled 2020-07-27 (×5): qty 1

## 2020-07-27 MED ORDER — NALBUPHINE HCL 10 MG/ML IJ SOLN: 5.0000 mg | INTRAMUSCULAR | Status: DC | PRN

## 2020-07-27 MED ORDER — PRENATAL MULTIVITAMIN CH
1.0000 | ORAL_TABLET | Freq: Every day | ORAL | Status: DC
  Administered 2020-07-27 – 2020-07-30 (×4): 1 via ORAL
  Filled 2020-07-27 (×4): qty 1

## 2020-07-27 MED ORDER — SOD CITRATE-CITRIC ACID 500-334 MG/5ML PO SOLN
ORAL | Status: AC
  Administered 2020-07-27: 30 mL via ORAL
  Filled 2020-07-27: qty 30

## 2020-07-27 MED ORDER — KETOROLAC TROMETHAMINE 30 MG/ML IJ SOLN
30.0000 mg | Freq: Four times a day (QID) | INTRAMUSCULAR | Status: AC
  Administered 2020-07-27 (×2): 30 mg via INTRAVENOUS
  Filled 2020-07-27 (×2): qty 1

## 2020-07-27 MED ORDER — NALOXONE HCL 0.4 MG/ML IJ SOLN: 0.4000 mg | INTRAMUSCULAR | Status: DC | PRN

## 2020-07-27 MED ORDER — MENTHOL 3 MG MT LOZG
1.0000 | LOZENGE | OROMUCOSAL | Status: DC | PRN
  Filled 2020-07-27: qty 9

## 2020-07-27 MED ORDER — OXYTOCIN-SODIUM CHLORIDE 30-0.9 UT/500ML-% IV SOLN
INTRAVENOUS | Status: AC
  Administered 2020-07-27: 2.5 [IU]/h via INTRAVENOUS
  Filled 2020-07-27: qty 1000

## 2020-07-27 MED ORDER — EPHEDRINE 5 MG/ML INJ
INTRAVENOUS | Status: AC
  Filled 2020-07-27: qty 20

## 2020-07-27 MED ORDER — LACTATED RINGERS IV SOLN: INTRAVENOUS | Status: DC

## 2020-07-27 MED ORDER — DIPHENHYDRAMINE HCL 25 MG PO CAPS: 25.0000 mg | ORAL_CAPSULE | ORAL | Status: DC | PRN

## 2020-07-27 MED ORDER — SODIUM CHLORIDE 0.9 % IV SOLN
INTRAVENOUS | Status: DC | PRN
  Administered 2020-07-27: 50 ug/min via INTRAVENOUS

## 2020-07-27 MED ORDER — PHENYLEPHRINE HCL (PRESSORS) 10 MG/ML IV SOLN
INTRAVENOUS | Status: AC
  Filled 2020-07-27: qty 1

## 2020-07-27 MED ORDER — SOD CITRATE-CITRIC ACID 500-334 MG/5ML PO SOLN: 30.0000 mL | Freq: Once | ORAL | Status: AC

## 2020-07-27 MED ORDER — LACTATED RINGERS IV SOLN: Freq: Once | INTRAVENOUS | Status: DC

## 2020-07-27 MED ORDER — KETOROLAC TROMETHAMINE 30 MG/ML IJ SOLN
INTRAMUSCULAR | Status: DC | PRN
  Administered 2020-07-27: 30 mg via INTRAVENOUS

## 2020-07-27 MED ORDER — BUPRENORPHINE HCL 8 MG SL SUBL
8.0000 mg | SUBLINGUAL_TABLET | Freq: Two times a day (BID) | SUBLINGUAL | Status: DC
  Administered 2020-07-27 – 2020-07-30 (×9): 8 mg via SUBLINGUAL
  Filled 2020-07-27 (×10): qty 1

## 2020-07-27 MED ORDER — ACETAMINOPHEN 10 MG/ML IV SOLN
INTRAVENOUS | Status: DC | PRN
  Administered 2020-07-27: 1000 mg via INTRAVENOUS

## 2020-07-27 MED ORDER — SIMETHICONE 80 MG PO CHEW
80.0000 mg | CHEWABLE_TABLET | Freq: Four times a day (QID) | ORAL | Status: DC
  Administered 2020-07-27 – 2020-07-30 (×15): 80 mg via ORAL
  Filled 2020-07-27 (×15): qty 1

## 2020-07-27 MED ORDER — MORPHINE SULFATE (PF) 0.5 MG/ML IJ SOLN
INTRAMUSCULAR | Status: AC
  Filled 2020-07-27: qty 10

## 2020-07-27 MED ORDER — FENTANYL CITRATE (PF) 100 MCG/2ML IJ SOLN
INTRAMUSCULAR | Status: DC | PRN
Start: 2020-07-27 — End: 2020-07-27
  Administered 2020-07-27: 15 ug via INTRATHECAL

## 2020-07-27 MED ORDER — SODIUM CHLORIDE 0.9% FLUSH: 3.0000 mL | INTRAVENOUS | Status: DC | PRN

## 2020-07-27 MED ORDER — BUPRENORPHINE HCL-NALOXONE HCL 8-2 MG SL SUBL: 1.0000 | SUBLINGUAL_TABLET | Freq: Two times a day (BID) | SUBLINGUAL | Status: DC

## 2020-07-27 MED ORDER — BUPIVACAINE IN DEXTROSE 0.75-8.25 % IT SOLN
INTRATHECAL | Status: DC | PRN
  Administered 2020-07-27: 1.6 mL via INTRATHECAL

## 2020-07-27 MED ORDER — ONDANSETRON HCL 4 MG/2ML IJ SOLN
INTRAMUSCULAR | Status: AC
  Filled 2020-07-27: qty 2

## 2020-07-27 MED ORDER — PROPOFOL 10 MG/ML IV BOLUS
INTRAVENOUS | Status: AC
  Filled 2020-07-27: qty 20

## 2020-07-27 MED ORDER — IBUPROFEN 800 MG PO TABS
800.0000 mg | ORAL_TABLET | Freq: Three times a day (TID) | ORAL | Status: AC
  Administered 2020-07-28 – 2020-07-30 (×7): 800 mg via ORAL
  Filled 2020-07-27 (×7): qty 1

## 2020-07-27 MED ORDER — LIDOCAINE 5 % EX PTCH
MEDICATED_PATCH | CUTANEOUS | Status: DC | PRN
  Administered 2020-07-27: 1 via TRANSDERMAL

## 2020-07-27 MED ORDER — CHLORHEXIDINE GLUCONATE 0.12 % MT SOLN
15.0000 mL | Freq: Once | OROMUCOSAL | Status: AC
  Administered 2020-07-27: 15 mL via OROMUCOSAL
  Filled 2020-07-27 (×2): qty 15

## 2020-07-27 MED ORDER — OXYTOCIN-SODIUM CHLORIDE 30-0.9 UT/500ML-% IV SOLN
INTRAVENOUS | Status: DC | PRN
  Administered 2020-07-27: 30 [IU] via INTRAVENOUS

## 2020-07-27 MED ORDER — MEPERIDINE HCL 25 MG/ML IJ SOLN: 6.2500 mg | INTRAMUSCULAR | Status: DC | PRN

## 2020-07-27 MED ORDER — TRAMADOL HCL 50 MG PO TABS
50.0000 mg | ORAL_TABLET | Freq: Four times a day (QID) | ORAL | Status: DC | PRN
  Administered 2020-07-28 (×2): 50 mg via ORAL
  Filled 2020-07-27 (×3): qty 1

## 2020-07-27 MED ORDER — FENTANYL CITRATE (PF) 100 MCG/2ML IJ SOLN
INTRAMUSCULAR | Status: AC
  Filled 2020-07-27: qty 2

## 2020-07-27 MED ORDER — SENNOSIDES-DOCUSATE SODIUM 8.6-50 MG PO TABS
2.0000 | ORAL_TABLET | ORAL | Status: DC
  Administered 2020-07-27 – 2020-07-30 (×4): 2 via ORAL
  Filled 2020-07-27 (×5): qty 2

## 2020-07-27 MED ORDER — ACETAMINOPHEN 500 MG PO TABS
1000.0000 mg | ORAL_TABLET | Freq: Four times a day (QID) | ORAL | Status: AC
  Administered 2020-07-27 – 2020-07-28 (×4): 1000 mg via ORAL
  Filled 2020-07-27 (×5): qty 2

## 2020-07-27 MED ORDER — LIDOCAINE 5 % EX PTCH
MEDICATED_PATCH | CUTANEOUS | Status: AC
  Filled 2020-07-27: qty 1

## 2020-07-27 MED ORDER — ORAL CARE MOUTH RINSE: 15.0000 mL | Freq: Once | OROMUCOSAL | Status: AC

## 2020-07-27 MED ORDER — DIPHENHYDRAMINE HCL 50 MG/ML IJ SOLN: 12.5000 mg | INTRAMUSCULAR | Status: DC | PRN

## 2020-07-27 SURGICAL SUPPLY — 33 items
ADHESIVE MASTISOL STRL (MISCELLANEOUS) ×2 IMPLANT
BAG COUNTER SPONGE EZ (MISCELLANEOUS) ×2 IMPLANT
BENZOIN TINCTURE PRP APPL 2/3 (GAUZE/BANDAGES/DRESSINGS) ×2 IMPLANT
CANISTER SUCT 3000ML PPV (MISCELLANEOUS) ×2 IMPLANT
CELL SAVER LIPIGURD (MISCELLANEOUS) ×1 IMPLANT
CHLORAPREP W/TINT 26 (MISCELLANEOUS) ×4 IMPLANT
CLOSURE STERI STRIP 1/2 X4 (GAUZE/BANDAGES/DRESSINGS) ×2 IMPLANT
COVER WAND RF STERILE (DRAPES) ×2 IMPLANT
DRSG TELFA 3X8 NADH (GAUZE/BANDAGES/DRESSINGS) ×2 IMPLANT
EXTRT SYSTEM ALEXIS 14CM (MISCELLANEOUS) ×2
GAUZE CURAFIL 4X4 (GAUZE/BANDAGES/DRESSINGS) IMPLANT
GAUZE SPONGE 4X4 12PLY STRL (GAUZE/BANDAGES/DRESSINGS) ×2 IMPLANT
GLOVE BIOGEL PI IND STRL 7.0 (GLOVE) ×1 IMPLANT
GLOVE BIOGEL PI INDICATOR 7.0 (GLOVE) ×1
GLOVE BIOGEL PI ORTHO PRO 7.5 (GLOVE) ×1
GLOVE PI ORTHO PRO STRL 7.5 (GLOVE) ×1 IMPLANT
GOWN STRL REUS W/ TWL LRG LVL3 (GOWN DISPOSABLE) ×2 IMPLANT
GOWN STRL REUS W/TWL LRG LVL3 (GOWN DISPOSABLE) ×2
KIT TURNOVER KIT A (KITS) ×2 IMPLANT
NS IRRIG 1000ML POUR BTL (IV SOLUTION) ×2 IMPLANT
PACK C SECTION (MISCELLANEOUS) IMPLANT
PACK C SECTION AR (MISCELLANEOUS) ×2 IMPLANT
PAD OB MATERNITY 4.3X12.25 (PERSONAL CARE ITEMS) ×2 IMPLANT
PAD PREP 24X41 OB/GYN DISP (PERSONAL CARE ITEMS) ×2 IMPLANT
PAD TELFA 2X3 NADH STRL (GAUZE/BANDAGES/DRESSINGS) ×2 IMPLANT
PENCIL SMOKE ULTRAEVAC 22 CON (MISCELLANEOUS) ×2 IMPLANT
RETRACTOR WND ALEXIS-O 25 LRG (MISCELLANEOUS) ×1 IMPLANT
RTRCTR WOUND ALEXIS O 25CM LRG (MISCELLANEOUS) ×2
SPONGE LAP 18X18 RF (DISPOSABLE) ×2 IMPLANT
SUT VIC AB 0 CTX 36 (SUTURE) ×2
SUT VIC AB 0 CTX36XBRD ANBCTRL (SUTURE) ×2 IMPLANT
SUT VIC AB 1 CT1 36 (SUTURE) ×4 IMPLANT
SUT VICRYL+ 3-0 36IN CT-1 (SUTURE) ×4 IMPLANT

## 2020-07-27 NOTE — Anesthesia Procedure Notes (Addendum)
Spinal  Patient location during procedure: OR Start time: 07/27/2020 11:04 AM End time: 07/27/2020 11:12 AM Staffing Performed: anesthesiologist  Anesthesiologist: Alver Fisher, MD Resident/CRNA: Lynden Oxford, CRNA Preanesthetic Checklist Completed: patient identified, IV checked, site marked, risks and benefits discussed, surgical consent, monitors and equipment checked, pre-op evaluation and timeout performed Spinal Block Patient position: sitting Prep: ChloraPrep Patient monitoring: heart rate, continuous pulse ox and blood pressure Approach: midline Location: L3-4 Injection technique: single-shot Needle Needle type: Introducer and Pencil-Tip  Needle gauge: 25 G Needle length: 9 cm Assessment Sensory level: T4 Additional Notes Attempt x 1 CRNA, attempt x 1 MDA

## 2020-07-27 NOTE — Op Note (Addendum)
      OP NOTE  Date: 07/27/2020   12:27 PM Name Renee [name] MR# [id number]  Preoperative Diagnosis: 1. Intrauterine pregnancy at [redacted]w[redacted]d 2. Desires Permanent Sterilization 3. Chronic Anemia 4. Subutex use  Active Problems:   Indication for care in labor or delivery  Postoperative Diagnosis: 1. Intrauterine pregnancy at [redacted]w[redacted]d, delivered 2. Desires Permanent Sterilization 3. Viable infant 4. Remainder same as pre-op  Procedure: 1. Repeat Low-Transverse Cesarean Section 2. Bilateral Tubal Occlusion  Surgeon: Elonda Husky, MD  Assistant:  Thompson CNM  No other capable assistant was available for this surgery which requires an experienced, high level assistant.  She provided exposure, dissection, suctioning, retraction, and general support and assistance during the procedure.    Anesthesia: Spinal   EBL:  400 mL   Findings: 1) female infant, Apgar scores of 9   at 1 minute and 9   at 5 minutes and a birthweight of 111.11  ounces.    2) Normal uterus, tubes and ovaries.   Procedure:   The patient was prepped and draped in the supine position and placed under spinal anesthesia.  A transverse incision was made across the abdomen in a Pfannenstiel manner. If indicated the old scar was systematically removed with sharp dissection.  We carried the dissection down to the level of the fascia.  The fascia was incised in a curvilinear manner.  The fascia was then elevated from the rectus muscles with blunt and sharp dissection.  The rectus muscles were separated laterally exposing the peritoneum.  The peritoneum was carefully entered with care being taken to avoid bowel and bladder.  A self-retaining retractor was placed.  The visceral peritoneum was incised in a curvilinear fashion across the lower uterine segment creating a bladder flap. A transverse incision was made across the lower uterine segment and extended laterally and superiorly using the bandage scissors.  Artificial rupture  membranes was performed and Clear fluid was noted.  The infant was delivered from the cephalic position.  A nuchal cord was not present. The cord was doubly clamped and cut. Cord blood was obtained if appropriate.  The infant was handed to the pediatric personnel  who then placed the infant under heat lamps where it was cleaned dried and re-suctioned. The placenta was delivered. The hysterotomy incision was then identified on ring forceps.  The uterine cavity was cleaned with a moist lap sponge.  The hysterotomy incision was closed with a running interlocking suture of Vicryl.  Hemostasis was excellent.  Pitocin was run in the IV and the uterus was found to be firm. The fallopian tubes were identified  and followed out to their fine fimbriated ends and then back to the mid-portion of the tube which was elevated on a babcock clamp.  Both tubes were completely occluded and triply tied using plain suture. A section of each tube was removed.  Hemostasis was noted. The posterior cul-de-sac and gutters were cleaned and inspected.  Hemostasis was noted.  The fascia was then closed with a running suture of #1 Vicryl.  Hemostasis of the subcutaneous tissues was obtained using the Bovie.  The subcutaneous tissues were closed with a running suture of 000 Vicryl.  A subcuticular suture was placed.  Steri-Strips were applied in the usual manner.  A pressure dressing was placed.  The patient went to the recovery room in stable condition.   Elonda Husky, M.D. 07/27/2020 12:27 PM

## 2020-07-27 NOTE — Transfer of Care (Signed)
Immediate Anesthesia Transfer of Care Note  Patient: Renee [name]  Procedure(s) Performed: CESAREAN SECTION WITH BILATERAL TUBAL LIGATION (N/A )  Patient Location: PACU and OB High Risk  Anesthesia Type:Spinal  Level of Consciousness: drowsy and patient cooperative  Airway & Oxygen Therapy: Patient Spontanous Breathing  Post-op Assessment: Report given to RN and Post -op Vital signs reviewed and stable  Post vital signs: Reviewed and stable  Last Vitals:  Vitals Value Taken Time  BP 137/77 07/27/20 1238  Temp    Pulse 55 07/27/20 1238  Resp 16 07/27/20 1238  SpO2 100 % 07/27/20 1238    Last Pain:  Vitals:   07/27/20 0600  TempSrc:   PainSc: 7       Patients Stated Pain Goal: 0 (07/27/20 0600)  Complications: No complications documented.

## 2020-07-27 NOTE — Interval H&P Note (Signed)
History and Physical Interval Note:  07/27/2020 11:06 AM  Renee [name]  has presented today for surgery, with the diagnosis of Encounter for supervision of normal first pregnancy in third trimester [redacted] weeks gestation of pregnancy Preop examination Acute cystitis with hematuria.  The various methods of treatment have been discussed with the patient and family. After consideration of risks, benefits and other options for treatment, the patient has consented to  Procedure(s): CESAREAN SECTION WITH BILATERAL TUBAL LIGATION (N/A) as a surgical intervention.  The patient's history has been reviewed, patient examined, no change in status, stable for surgery.  I have reviewed the patient's chart and labs.  Questions were answered to the patient's satisfaction.     Brennan Bailey
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
## 2020-07-28 LAB — TYPE AND SCREEN
ABO/RH(D): A POS
Antibody Screen: NEGATIVE
Extend sample reason: UNDETERMINED
Unit division: 0
Unit division: 0

## 2020-07-28 LAB — BPAM RBC
Blood Product Expiration Date: 202110032359
Blood Product Expiration Date: 202110032359
ISSUE DATE / TIME: 202108292246
ISSUE DATE / TIME: 202108300238
Unit Type and Rh: 6200
Unit Type and Rh: 6200

## 2020-07-28 LAB — SURGICAL PATHOLOGY

## 2020-07-28 NOTE — Progress Notes (Signed)
Patient ID: Renee [name], female   DOB: 12/05/1990, 29 y.o.   MRN: [id number]    Progress Note - Cesarean Delivery  Renee [name] is a 29 y.o. G4P4004 now PP day 1 s/p C-Section, Low Transverse .   Subjective:  Patient reports no problems with eating, bowel movements, voiding, or their wound  Pain controlled with current medications.  Patient has been out of bed and is eating regular food.  Objective:  Vital signs in last 24 hours: Temp:  [97.6 F (36.4 C)-98.4 F (36.9 C)] 98.4 F (36.9 C) (08/30 2030) Pulse Rate:  [46-212] 46 (08/31 0443) Resp:  [10-19] 16 (08/30 2030) BP: (95-137)/(52-85) 98/52 (08/31 0443) SpO2:  [93 %-100 %] 100 % (08/31 0443)  Physical Exam:  General: alert, cooperative and no distress Lochia: appropriate Uterine Fundus: firm Incision: Dressing intact    Data Review Recent Labs    07/26/20 2238 07/27/20 0628  HGB 6.6* 8.1*  HCT 23.4* 27.3*    Assessment:  Active Problems:   Indication for care in labor or delivery   Status post Cesarean section. Doing well postoperatively.     Plan:       Continue current care.    Elonda Husky, M.D. 07/28/2020 8:08 AM

## 2020-07-28 NOTE — Anesthesia Post-op Follow-up Note (Signed)
  Anesthesia Pain Follow-up Note  Patient: Renee [name]  Day #: 1  Date of Follow-up: 07/28/2020 Time: 7:04 AM  Last Vitals:  Vitals:   07/27/20 2030 07/28/20 0443  BP: (!) 95/52 (!) 98/52  Pulse:  (!) 46  Resp: 16   Temp: 36.9 C   SpO2: 99% 100%    Level of Consciousness: alert  Pain: none   Side Effects:None  Catheter Site Exam:clean, dry, no drainage     Plan: D/C from anesthesia care at surgeon's request  Starling Manns

## 2020-07-28 NOTE — Progress Notes (Signed)
Incision cleaning kit provided. Pt educated and verbalized understanding.  °

## 2020-07-28 NOTE — Anesthesia Postprocedure Evaluation (Signed)
Anesthesia Post Note  Patient: Renee [name]  Procedure(s) Performed: CESAREAN SECTION WITH BILATERAL TUBAL LIGATION (N/A )  Patient location during evaluation: Mother Baby Anesthesia Type: Spinal Level of consciousness: oriented and awake and alert Pain management: pain level controlled Vital Signs Assessment: post-procedure vital signs reviewed and stable Respiratory status: spontaneous breathing and respiratory function stable Cardiovascular status: blood pressure returned to baseline and stable Postop Assessment: no headache, no backache, no apparent nausea or vomiting and able to ambulate Anesthetic complications: no   No complications documented.   Last Vitals:  Vitals:   07/27/20 2030 07/28/20 0443  BP: (!) 95/52 (!) 98/52  Pulse:  (!) 46  Resp: 16   Temp: 36.9 C   SpO2: 99% 100%    Last Pain:  Vitals:   07/28/20 0340  TempSrc:   PainSc: 4                  Starling Manns

## 2020-07-29 MED ORDER — OXYCODONE HCL 5 MG PO TABS
10.0000 mg | ORAL_TABLET | ORAL | Status: DC | PRN
  Administered 2020-07-29 – 2020-07-30 (×8): 10 mg via ORAL
  Filled 2020-07-29 (×8): qty 2

## 2020-07-29 NOTE — TOC Initial Note (Signed)
Transition of Care [facility]) - Initial/Assessment Note    Patient Details  Name: Renee [name] MRN: [id number] Date of Birth: 13-May-1991  Transition of Care [facility]) CM/SW Contact:    Renee Cellar, RN Phone Number: 07/29/2020, 3:07 PM  Clinical Narrative:                 Spoke with patient at bedside with Renee [name] holding infant. Renee [name] confirmed active with Subutex clinic for 2 years. Renee [name] confirmed she does not do any other drugs and does not smoke or drink. Lives with her 3 other children. Renee [name] is not currently involved. Infant will remain inpatient for 5 days under Eat, Sleep, Console policy to monitor for weaning. Renee [name] will remain with infant. Infant UDS negative and following for cord blood. Infant will be followed by [name]. Discussed PPD and SIDS in detail including importance of good hand hygiene. Renee [name] confirmed she will be breastfeeding due to concerns regarding Subutex. Renee [name] will be Renee [name]. No concerns noted and TOC will follow cord blood for results.         Patient Goals and CMS Choice        Expected Discharge Plan and Services                                                Prior Living Arrangements/Services                       Activities of Daily Living Home Assistive Devices/Equipment: None ADL Screening (condition at time of admission) Patient's cognitive ability adequate to safely complete daily activities?: Yes Is the patient deaf or have difficulty hearing?: No Does the patient have difficulty seeing, even when wearing glasses/contacts?: No Does the patient have difficulty concentrating, remembering, or making decisions?: No Patient able to express need for assistance with ADLs?: Yes Does the patient have difficulty dressing or bathing?: No Independently performs ADLs?: Yes (appropriate for developmental age) Does the patient have difficulty walking or climbing stairs?: No Weakness of Legs: None Weakness of Arms/Hands:  None  Permission Sought/Granted                  Emotional Assessment              Admission diagnosis:  Indication for care in labor or delivery [O75.9] Patient Active Problem List   Diagnosis Date Noted  . Indication for care in labor or delivery 07/27/2020  . Anemia in pregnancy 04/10/2020  . Anemia complicating pregnancy, second trimester 04/04/2020  . Type A blood, Rh positive 04/03/2020  . Late prenatal care 04/03/2020  . Constipation during pregnancy in second trimester 04/03/2020  . Pregnancy 03/26/2020  . Pregnancy complicated by subutex maintenance, antepartum (HCC) 05/15/2019  . History of 3 cesarean sections 05/15/2019  . Pregnant 11/13/2018  . History of low transverse cesarean section 11/13/2018   PCP:  [facility], [name]:   [facility] [address] [place], [place] - [address] [place] [address] Phone: [phone] Fax: [phone]  [facility] [address] [place] - [address] [place] [address] Phone: [phone] Fax: [phone]     Social Determinants of Health (SDOH) Interventions    Readmission Risk Interventions No flowsheet data found.

## 2020-07-29 NOTE — Progress Notes (Signed)
Patient ID: Renee [name], female   DOB: December 28, 1990, 29 y.o.   MRN: [id number]  Renee [name] is a 29 year old G4P4004 Post Partum Day # 2, s/p C-Section, Low Transverse with bilateral tubal ligation  Subjective:  Patient sitting in bed, holding infant. Pain: 5/10.   Denies difficulty breathing or respiratory distress, chest pain, excessive vaginal bleeding, dysuria, and leg pain or swelling.   Objective:  Temp:  [97.5 F (36.4 C)-98.4 F (36.9 C)] 98.4 F (36.9 C) (09/01 0834) Pulse Rate:  [49-67] 61 (09/01 0834) Resp:  [18] 18 (09/01 0834) BP: (95-105)/(52-64) 98/61 (09/01 0834) SpO2:  [100 %] 100 % (09/01 0834)  Physical Exam:   General: alert and cooperative   Lungs: clear to auscultation bilaterally  Breasts: deferred, no complaints  Heart: normal apical impulse  Abdomen: soft, non-tender; bowel sounds normal; no masses,  no organomegaly; incision well approximated  Pelvis: Lochia: appropriate,  Uterine Fundus: firm  Extremities: DVT Evaluation: No evidence of DVT seen on physical exam.  Recent Labs    07/26/20 2238 07/27/20 0628  HGB 6.6* 8.1*  HCT 23.4* 27.3*    Assessment:  Renee [name] is a 29 year old G54P4004 Post Partum Day # 2, s/p C-Section, Low Transverse with bilateral tubal ligation  Formula feeding  Plan:  Plan for discharge tomorrow   LOS: 2 days   Gunnar Bulla, CNM [facility] 07/29/2020 8:49 AM

## 2020-07-30 LAB — CBC
HCT: 25.4 % — ABNORMAL LOW (ref 36.0–46.0)
Hemoglobin: 7.3 g/dL — ABNORMAL LOW (ref 12.0–15.0)
MCH: 20.3 pg — ABNORMAL LOW (ref 26.0–34.0)
MCHC: 28.7 g/dL — ABNORMAL LOW (ref 30.0–36.0)
MCV: 70.6 fL — ABNORMAL LOW (ref 80.0–100.0)
Platelets: 230 10*3/uL (ref 150–400)
RBC: 3.6 MIL/uL — ABNORMAL LOW (ref 3.87–5.11)
RDW: 19.9 % — ABNORMAL HIGH (ref 11.5–15.5)
WBC: 7.6 10*3/uL (ref 4.0–10.5)
nRBC: 0 % (ref 0.0–0.2)

## 2020-07-30 MED ORDER — IBUPROFEN 800 MG PO TABS: 800.0000 mg | ORAL_TABLET | Freq: Three times a day (TID) | ORAL | 0 refills | Status: DC

## 2020-07-30 MED ORDER — OXYCODONE HCL 10 MG PO TABS
10.0000 mg | ORAL_TABLET | ORAL | 0 refills | Status: DC | PRN
Start: 2020-07-30 — End: 2021-03-12

## 2020-07-30 NOTE — Progress Notes (Signed)
Pt discharged.  Discharge instructions, prescriptions and follow up appointment given to and reviewed with pt. Pt verbalized understanding.  Staying in room 338, infant transferred to pediatrics this pm for ESC.

## 2020-07-30 NOTE — Care Management Important Message (Signed)
Important Message  Patient Details  Name: TRENIA TENNYSON MRN: [id number] Date of Birth: 1991/03/28   Medicare Important Message Given:  Yes     Johnell Comings 07/30/2020, 1:57 PM

## 2020-07-30 NOTE — Progress Notes (Signed)
VSS. Bedtime meds given. Pt in room with infant. No iv present. Pt educated on feeds and I&O documentation.

## 2020-07-30 NOTE — Discharge Instructions (Signed)

## 2020-07-30 NOTE — Discharge Summary (Signed)
Physician Obstetric Discharge Summary  Patient ID: Renee [name] MRN: [id number] DOB/AGE: 07-31-1991 29 y.o.   Date of Admission: 07/26/2020  Date of Discharge:  07/30/20  Admitting Diagnosis: Scheduled cesarean section at [redacted]w[redacted]d  Secondary Diagnosis:   Patient Active Problem List   Diagnosis Date Noted  . Indication for care in labor or delivery 07/27/2020  . Anemia in pregnancy 04/10/2020  . Anemia complicating pregnancy, second trimester 04/04/2020  . Type A blood, Rh positive 04/03/2020  . Late prenatal care 04/03/2020  . Constipation during pregnancy in second trimester 04/03/2020  . Pregnancy 03/26/2020  . Pregnancy complicated by subutex maintenance, antepartum (HCC) 05/15/2019  . History of 3 cesarean sections 05/15/2019  . Pregnant 11/13/2018  . History of low transverse cesarean section 11/13/2018    Mode of Delivery: repeat cesarean section and tubal ligation was performed- low uterine, transverse     Discharge Diagnosis: No other diagnosis   Intrapartum Procedures: Atificial rupture of membranes and tubal ligation   Post partum procedures: None  Complications: None   Brief Hospital Course    Renee [name] is a [id number] who underwent cesarean section on 07/27/2020.  Patient had an uncomplicated surgery; for further details of this surgery, please refer to the operative note.  Patient had an uncomplicated postpartum course.  By time of discharge on POD#3/PPD#3, her pain was controlled on oral pain medications; she had appropriate lochia and was ambulating, voiding without difficulty, tolerating regular diet and passing flatus.   She was deemed stable for discharge to home.    Labs: CBC Latest Ref Rng & Units 07/27/2020 07/26/2020 07/24/2020  WBC 4.0 - 10.5 K/uL 7.4 - 8.1  Hemoglobin 12.0 - 15.0 g/dL 8.1(L) 6.6(L) 6.2(L)  Hematocrit 36 - 46 % 27.3(L) 23.4(L) 21.6(L)  Platelets 150 - 400 K/uL 213 - 256   A POS  Physical exam:   Temp:  [97.8 F (36.6 C)-98.5 F  (36.9 C)] 98.3 F (36.8 C) (09/02 0742) Pulse Rate:  [50-79] 50 (09/02 0742) Resp:  [16-20] 16 (09/02 0742) BP: (93-100)/(43-61) 100/43 (09/02 0742) SpO2:  [100 %] 100 % (09/02 0742)  General: alert and no distress  Lochia: appropriate  Abdomen: soft, NT  Uterine Fundus: firm  Incision: healing well, no significant drainage, no dehiscence, no significant erythema  Extremities: No evidence of DVT seen on physical exam. No lower extremity edema.  Discharge Instructions: Per After Visit Summary.  Activity: Advance as tolerated. Pelvic rest for 6 weeks.  Also refer to After  Visit Summary  Diet: Regular  Medications: Allergies as of 07/30/2020      Reactions   Hydrocodone Itching   Morphine And Related Rash   Penicillins Rash   Has patient had a PCN reaction causing immediate rash, facial/tongue/throat swelling, SOB or lightheadedness with hypotension: Yes Has patient had a PCN reaction causing severe rash involving mucus membranes or skin necrosis: No Has patient had a PCN reaction that required hospitalization: No Has patient had a PCN reaction occurring within the last 10 years: No If all of the above answers are "NO", then may proceed with Cephalosporin use.      Medication List    STOP taking these medications   nitrofurantoin (macrocrystal-monohydrate) 100 MG capsule Commonly known as: MACROBID     TAKE these medications   acetaminophen 500 MG tablet Commonly known as: TYLENOL Take 2 tablets (1,000 mg total) by mouth every 6 (six) hours as needed for mild pain or moderate pain.   buprenorphine 8 MG  Subl SL tablet Commonly known as: SUBUTEX Place 8 mg under the tongue 2 (two) times a day.   CitraNatal Bloom 90-1 MG Tabs Take 1 tablet by mouth daily.   hydrocortisone 2.5 % rectal cream Commonly known as: Procto-Med HC Place 1 application rectally 2 (two) times daily.   ibuprofen 800 MG tablet Commonly known as: ADVIL Take 1 tablet (800 mg total) by mouth  every 8 (eight) hours.   Oxycodone HCl 10 MG Tabs Take 1 tablet (10 mg total) by mouth every 4 (four) hours as needed for severe pain.            Discharge Care Instructions  (From admission, onward)         Start     Ordered   07/30/20 0000  Leave dressing on - Keep it clean, dry, and intact until clinic visit        07/30/20 0811         Outpatient follow up:   Follow-up Information    Doreene Burke, CNM Follow up.   Specialties: Certified Nurse Midwife, Radiology Why: Please follow up as scheduled Contact information: [address] [place] [address] [phone]              Postpartum contraception: bilateral tubal ligation  Discharged Condition: stable  Discharged to: home   Newborn Data:  Disposition:home with mother  Apgars: APGAR (1 MIN): 9   APGAR (5 MINS): 9    Baby Feeding: Bottle    Serafina Royals, CNM  [facility], [facility] 07/30/20 8:12 AM

## 2020-08-11 ENCOUNTER — Encounter: Payer: Medicare Other | Admitting: Certified Nurse Midwife

## 2020-08-25 ENCOUNTER — Encounter: Payer: Medicare Other | Admitting: Certified Nurse Midwife

## 2020-09-01 ENCOUNTER — Encounter: Payer: Medicare Other | Admitting: Certified Nurse Midwife

## 2021-03-11 ENCOUNTER — Emergency Department
Admission: EM | Admit: 2021-03-11 | Discharge: 2021-03-11 | Disposition: A | Discharge status: Home / Self Care | Payer: Medicare Other | Attending: Physician Assistant | Admitting: Physician Assistant

## 2021-03-11 ENCOUNTER — Encounter: Payer: Self-pay | Admitting: Emergency Medicine

## 2021-03-11 ENCOUNTER — Other Ambulatory Visit: Payer: Self-pay

## 2021-03-11 DIAGNOSIS — K0889 Other specified disorders of teeth and supporting structures: Secondary | ICD-10-CM | POA: Diagnosis present

## 2021-03-11 DIAGNOSIS — K029 Dental caries, unspecified: Secondary | ICD-10-CM | POA: Insufficient documentation

## 2021-03-11 MED ORDER — IBUPROFEN 800 MG PO TABS: 800.0000 mg | ORAL_TABLET | Freq: Three times a day (TID) | ORAL | 0 refills | Status: DC | PRN

## 2021-03-11 MED ORDER — LIDOCAINE VISCOUS HCL 2 % MT SOLN: 5.0000 mL | Freq: Four times a day (QID) | OROMUCOSAL | 0 refills | Status: DC | PRN

## 2021-03-11 MED ORDER — AMOXICILLIN 500 MG PO CAPS: 500.0000 mg | ORAL_CAPSULE | Freq: Three times a day (TID) | ORAL | 0 refills | Status: DC
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
## 2021-03-11 NOTE — Discharge Instructions (Addendum)
Advised to take medication as directed.  Follow-up for list of dental clinics provided in your discharge care instructions. OPTIONS FOR DENTAL FOLLOW UP CARE  [place] Department [facility] Health and Human Services - Local Safety Net Dental Clinics [email or web address]   [facility] [phone])  [facility] [phone])  [place] [phone] ext [phone])  [facility] [phone])  [facility] [phone]) This clinic caters to the indigent population and is on a lottery system. Location: [employer] [facility], [employer], [address] [place] Clinic Hours: Wednesdays from 6pm - 9pm, patients seen by a lottery system. For dates, call or go to [email or web address] Services: Cleanings, fillings and simple extractions. Payment Options: DENTAL WORK IS FREE OF CHARGE. Bring proof of income or support. Best way to get seen: Arrive at 5:15 pm - this is a lottery, NOT first come/first serve, so arriving earlier will not increase your chances of being seen.     [facility] [phone] Select option 1 for emergencies   Location: [facility], [place], [address] [place] Clinic Hours: No walk-ins accepted - call the day before to schedule an appointment. Check in times are 9:30 am and 1:30 pm. Services: Simple extractions, temporary fillings, pulpectomy/pulp debridement, uncomplicated abscess drainage. Payment Options: PAYMENT IS DUE AT THE TIME OF SERVICE.  Fee is usually $100-200, additional surgical procedures (e.g. abscess drainage) may be extra. Cash, checks, Visa/MasterCard accepted.  Can file Medicaid if patient is covered for dental - patient should call case worker to check. No discount for [facility] patients. Best way to get seen: MUST call the day before and get onto the  schedule. Can usually be seen the next 1-2 days. No walk-ins accepted.     [facility] [phone]   Location: [facility], [address] [place] Clinic Hours: M, W, Th, F 8am or 1:30pm, Tues 9a or 1:30 - first come/first served. Services: Simple extractions, temporary fillings, uncomplicated abscess drainage.  You do not need to be an [facility] resident. Payment Options: PAYMENT IS DUE AT THE TIME OF SERVICE. Dental insurance, otherwise sliding scale - bring proof of income or support. Depending on income and treatment needed, cost is usually $50-200. Best way to get seen: Arrive early as it is first come/first served.     [facility] [phone]   Location: [address] Clinic Hours: Mon-Thu 8a-5p Services: Most basic dental services including extractions and fillings. Payment Options: PAYMENT IS DUE AT THE TIME OF SERVICE. Sliding scale, up to 50% off - bring proof if income or support. Medicaid with dental option accepted. Best way to get seen: Call to schedule an appointment, can usually be seen within 2 weeks OR they will try to see walk-ins - show up at 8a or 2p (you may have to wait).     [facility] [phone] [place] RESIDENTS ONLY   Location: [facility], [address], [place], [place] [address] Clinic Hours: By appointment only. Monday - Thursday 8am-5pm, Friday 8am-12pm Services: Cleanings, fillings, extractions. Payment Options: PAYMENT IS DUE AT THE TIME OF SERVICE. Cash, Visa or MasterCard. Sliding scale - $30 minimum per service. Best way to get seen: Come in to office, complete packet and make an appointment - need proof of income or support monies for each household member and proof of [facility] residence. Usually takes about a month  to get in.     [facility] [phone]    Location: [address] [facility] Clinic Hours: Walk-in Urgent Care Dental Services are offered Monday-Friday mornings only. The numbers of emergencies accepted daily is limited to the number of providers available. Maximum 15 - Mondays, Wednesdays & Thursdays Maximum 10 - Tuesdays & Fridays Services: You do not need to be a [facility] resident to be seen for a dental emergency. Emergencies are defined as pain, swelling, abnormal bleeding, or dental trauma. Walkins will receive x-rays if needed. NOTE: Dental cleaning is not an emergency. Payment Options: PAYMENT IS DUE AT THE TIME OF SERVICE. Minimum co-pay is $40.00 for uninsured patients. Minimum co-pay is $3.00 for Medicaid with dental coverage. Dental Insurance is accepted and must be presented at time of visit. Medicare does not cover dental. Forms of payment: Cash, credit card, checks. Best way to get seen: If not previously registered with the clinic, walk-in dental registration begins at 7:15 am and is on a first come/first serve basis. If previously registered with the clinic, call to make an appointment.     [facility] [phone] [place] RESIDENTS ONLY   Location: [address], [place], [place] Clinic Hours: Mon-Thu 10a-2p Services: Extractions only! Payment Options: FREE (donations accepted) - bring proof of income or support Best way to get seen: Call and schedule an appointment OR come at 8am on the 1st Monday of every month (except for holidays) when it is first come/first served.     [facility] [phone]   Location: [address] [place], [place] Clinic Hours: Friday mornings Services, Payment Options, Best way to get seen: Call for info

## 2021-03-11 NOTE — ED Triage Notes (Signed)
Pt comes into the ED via POV c/o left lower dental pain.  Pt in NAD at this time with even and unlabored respirations.

## 2021-03-11 NOTE — ED Notes (Signed)
See triage note  Presents with dental pain   States pain is left lower gumline

## 2021-03-12 ENCOUNTER — Other Ambulatory Visit: Payer: Self-pay

## 2021-03-12 ENCOUNTER — Encounter: Payer: Self-pay | Admitting: Emergency Medicine

## 2021-03-12 ENCOUNTER — Emergency Department
Admission: EM | Admit: 2021-03-12 | Discharge: 2021-03-12 | Disposition: A | Discharge status: Home / Self Care | Payer: Medicare Other | Attending: Physician Assistant | Admitting: Physician Assistant

## 2021-03-12 DIAGNOSIS — K0889 Other specified disorders of teeth and supporting structures: Secondary | ICD-10-CM | POA: Insufficient documentation

## 2021-03-12 MED ORDER — KETOROLAC TROMETHAMINE 30 MG/ML IJ SOLN
30.0000 mg | Freq: Once | INTRAMUSCULAR | Status: AC
  Administered 2021-03-12: 30 mg via INTRAMUSCULAR
  Filled 2021-03-12: qty 1
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
## 2021-03-12 NOTE — ED Notes (Signed)
See triage note  Presents with swelling to left side of face  Was seen yesterday for dental pain  States woke up with swelling and redness to left side of face

## 2021-03-12 NOTE — ED Notes (Signed)
This RN to bedside to answer call bell, pt states she needs to know what is taking so long to see a provider, this RN apologized for wait time and expressed that provider will be in as soon as available. Pt denies any other needs

## 2021-03-12 NOTE — Discharge Instructions (Addendum)
OPTIONS FOR DENTAL FOLLOW UP CARE ° °[facility] [facility] [employer] [facility] - Local Safety Net Dental Clinics °http://www.ncdhhs.gov/dph/oralhealth/services/safetynetclinics.htm °  °[facility] (336-562-3123) ° °[facility] [place] (919-933-9087) ° °[facility] ([phone] ext [phone]) ° °[facility] ([phone]) ° °[facility] ([phone]) °This clinic caters to the indigent population and is on a lottery system. °Location: °[facility], [address], [place] °Clinic Hours: °Wednesdays from 6pm - 9pm, patients seen by a lottery system. °For dates, call or go to [email or web address]/[facility]/[email or web address] °Services: °Cleanings, fillings and simple extractions. °Payment Options: °DENTAL WORK IS FREE OF CHARGE. Bring proof of income or support. °Best way to get seen: °Arrive at 5:15 pm - this is a lottery, NOT first come/first serve, so arriving earlier will not increase your chances of being seen. °  °  °[facility] °[phone] °Select option 1 for emergencies °  °Location: °[facility], [address], [place] °Clinic Hours: °No walk-ins accepted - call the day before to schedule an appointment. °Check in times are 9:30 am and 1:30 pm. °Services: °Simple extractions, temporary fillings, pulpectomy/pulp debridement, uncomplicated abscess drainage. °Payment Options: °PAYMENT IS DUE AT THE TIME OF SERVICE.  Fee is usually $100-200, additional surgical procedures (e.g. abscess drainage) may be extra. °Cash, checks, Visa/MasterCard accepted.  Can file Medicaid if patient is covered for dental - patient should call case worker to check. °No discount for [facility] patients. °Best way to get seen: °MUST call the day before and get onto the schedule. Can usually be seen the next 1-2 days. No walk-ins accepted. °  °  °[facility] °[phone] °   °Location: °[facility], [address], [place] °Clinic Hours: °M, W, Th, F 8am or 1:30pm, Tues 9a or 1:30 - first come/first served. °Services: °Simple extractions, temporary fillings, uncomplicated abscess drainage.  You do not need to be an [place] resident. °Payment Options: °PAYMENT IS DUE AT THE TIME OF SERVICE. °Dental insurance, otherwise sliding scale - bring proof of income or support. °Depending on income and treatment needed, cost is usually $50-200. °Best way to get seen: °Arrive early as it is first come/first served. °  °  °[facility] °[phone] °  °Location: °[address] °Clinic Hours: °Mon-Thu 8a-5p °Services: °Most basic dental services including extractions and fillings. °Payment Options: °PAYMENT IS DUE AT THE TIME OF SERVICE. °Sliding scale, up to 50% off - bring proof if income or support. °Medicaid with dental option accepted. °Best way to get seen: °Call to schedule an appointment, can usually be seen within 2 weeks OR they will try to see walk-ins - show up at 8a or 2p (you may have to wait). °  °  °[facility] °919-245-2435 °[place] RESIDENTS ONLY °  °Location: °[facility], [address], [place],  [address] °Clinic Hours: By appointment only. °Monday - Thursday 8am-5pm, Friday 8am-12pm °Services: Cleanings, fillings, extractions. °Payment Options: °PAYMENT IS DUE AT THE TIME OF SERVICE. °Cash, Visa or MasterCard. Sliding scale - $30 minimum per service. °Best way to get seen: °Come in to office, complete packet and make an appointment - need proof of income °or support monies for each household member and proof of [place] residence. °Usually takes about a month to get in. °  °  °[facility] °[phone] °  °Location: °[address]   [place] °Clinic Hours: Walk-in Urgent Care Dental Services are offered Monday-Friday  mornings only. °The numbers of emergencies accepted daily is limited to the number of °providers available. °Maximum 15 - Mondays, Wednesdays & Thursdays °Maximum 10 - Tuesdays & Fridays °Services: °You do not need to be a [place] resident to be seen for a dental emergency. °Emergencies are defined as pain, swelling, abnormal bleeding, or dental trauma. Walkins will receive x-rays if needed. °NOTE: Dental cleaning is not an emergency. °Payment Options: °PAYMENT IS DUE AT THE TIME OF SERVICE. °Minimum co-pay is $40.00 for uninsured patients. °Minimum co-pay is $3.00 for Medicaid with dental coverage. °Dental Insurance is accepted and must be presented at time of visit. °Medicare does not cover dental. °Forms of payment: Cash, credit card, checks. °Best way to get seen: °If not previously registered with the clinic, walk-in dental registration begins at 7:15 am and is on a first come/first serve basis. °If previously registered with the clinic, call to make an appointment. °  °  °[facility] °[phone] °[place] RESIDENTS ONLY °  °Location: °[address], [place], [place] °Clinic Hours: °Mon-Thu 10a-2p °Services: Extractions only! °Payment Options: °FREE (donations accepted) - bring proof of income or support °Best way to get seen: °Call and schedule an appointment OR come at 8am on the 1st Monday of every month (except for holidays) when it is first come/first served. °  °  °[facility] °[phone] °  °Location: °[address],  °Clinic Hours: °Friday mornings °Services, Payment Options, Best way to get seen: °Call for info °

## 2021-09-20 ENCOUNTER — Emergency Department (HOSPITAL_COMMUNITY)
Admission: EM | Admit: 2021-09-20 | Discharge: 2021-09-21 | Discharge status: Against Medical Advice | Payer: Medicare Other

## 2021-09-21 ENCOUNTER — Other Ambulatory Visit: Payer: Self-pay

## 2021-09-21 ENCOUNTER — Emergency Department: Payer: Medicare Other

## 2021-09-21 DIAGNOSIS — Z7982 Long term (current) use of aspirin: Secondary | ICD-10-CM | POA: Diagnosis not present

## 2021-09-21 DIAGNOSIS — R0789 Other chest pain: Secondary | ICD-10-CM | POA: Diagnosis present

## 2021-09-21 DIAGNOSIS — R42 Dizziness and giddiness: Secondary | ICD-10-CM | POA: Insufficient documentation

## 2021-09-21 DIAGNOSIS — Z79899 Other long term (current) drug therapy: Secondary | ICD-10-CM | POA: Diagnosis not present

## 2021-09-21 DIAGNOSIS — R0602 Shortness of breath: Secondary | ICD-10-CM | POA: Insufficient documentation

## 2021-09-21 LAB — CBC
HCT: 30.3 % — ABNORMAL LOW (ref 36.0–46.0)
Hemoglobin: 9.3 g/dL — ABNORMAL LOW (ref 12.0–15.0)
MCH: 22.9 pg — ABNORMAL LOW (ref 26.0–34.0)
MCHC: 30.7 g/dL (ref 30.0–36.0)
MCV: 74.6 fL — ABNORMAL LOW (ref 80.0–100.0)
Platelets: 227 10*3/uL (ref 150–400)
RBC: 4.06 MIL/uL (ref 3.87–5.11)
RDW: 15.1 % (ref 11.5–15.5)
WBC: 6.5 10*3/uL (ref 4.0–10.5)
nRBC: 0 % (ref 0.0–0.2)

## 2021-09-21 LAB — BASIC METABOLIC PANEL
Anion gap: 5 (ref 5–15)
BUN: 14 mg/dL (ref 6–20)
CO2: 28 mmol/L (ref 22–32)
Calcium: 8.9 mg/dL (ref 8.9–10.3)
Chloride: 104 mmol/L (ref 98–111)
Creatinine, Ser: 0.66 mg/dL (ref 0.44–1.00)
GFR, Estimated: >60 mL/min (ref >60)
Glucose, Bld: 106 mg/dL — ABNORMAL HIGH (ref 70–99)
Potassium: 4.1 mmol/L (ref 3.5–5.1)
Sodium: 137 mmol/L (ref 135–145)

## 2021-09-21 LAB — TROPONIN I (HIGH SENSITIVITY)
Troponin I (High Sensitivity): <2 ng/L (ref <18)
Troponin I (High Sensitivity): <2 ng/L (ref <18)

## 2021-09-21 NOTE — ED Provider Notes (Signed)
Emergency Medicine Provider Triage Evaluation Note  Renee [name] , a 30 y.o. female  was evaluated in triage.  Pt complains of shortness of breath and chest pain.  Review of Systems  Positive: Feeling little lightheaded having slight sense of chest pain that has now resolved also feeling bit short of breath all day.  She is also felt slightly tingly on her face a little more on the right side is also resolved but started yesterday Negative: Heavy bleeding.  Denies pregnancy.  Patient does report a history of anemia with low iron but no history of bleeding issues  Physical Exam  BP 93/76   Pulse 73   Temp 98.2 F (36.8 C)   Resp 20   Ht 5\' 3"  (1.6 m)   Wt 64.9 kg   LMP 09/16/2021   SpO2 99%   BMI 25.33 kg/m  Gen:   Awake, no distress   Resp:  Normal effort  MSK:   Moves extremities without difficulty  Other:  Very pleasant.  In no acute distress.  Triage EKG is reviewed, no evidence of acute coronary syndrome or STEMI  Medical Decision Making  Medically screening exam initiated at 5:16 PM.  Appropriate orders placed.  Renee [name] was informed that the remainder of the evaluation will be completed by another provider, this initial triage assessment does not replace that evaluation, and the importance of remaining in the ED until their evaluation is complete.     Danella Sensing, MD 09/21/21 [phone]

## 2021-09-21 NOTE — ED Triage Notes (Signed)
Pt to ED for chest pain and shob that started today. +dizziness.  Reports was numb and tingly to right side of face yesterday.  Hx anxiety  NAD noted.  Speaking in complete sentences

## 2021-09-22 ENCOUNTER — Emergency Department
Admission: EM | Admit: 2021-09-22 | Discharge: 2021-09-22 | Disposition: A | Discharge status: Home / Self Care | Payer: Medicare Other | Attending: Emergency Medicine | Admitting: Emergency Medicine

## 2021-09-22 ENCOUNTER — Emergency Department: Payer: Medicare Other

## 2021-09-22 DIAGNOSIS — R0789 Other chest pain: Secondary | ICD-10-CM | POA: Diagnosis not present

## 2021-09-22 DIAGNOSIS — R079 Chest pain, unspecified: Secondary | ICD-10-CM

## 2021-09-22 LAB — POC URINE PREG, ED: Preg Test, Ur: NEGATIVE

## 2021-09-22 MED ORDER — IOHEXOL 350 MG/ML SOLN
75.0000 mL | Freq: Once | INTRAVENOUS | Status: AC | PRN
  Administered 2021-09-22: 75 mL via INTRAVENOUS
  Filled 2021-09-22: qty 75

## 2021-09-22 MED ORDER — ASPIRIN EC 81 MG PO TBEC: 81.0000 mg | DELAYED_RELEASE_TABLET | Freq: Every day | ORAL | 2 refills | Status: AC

## 2021-09-22 NOTE — ED Notes (Signed)
Dr. Don Perking seeing patient in triage 3 at this time.
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
## 2021-09-22 NOTE — Discharge Instructions (Signed)

## 2021-09-24 ENCOUNTER — Ambulatory Visit: Payer: Medicare Other | Admitting: Cardiology

## 2021-10-07 ENCOUNTER — Ambulatory Visit (INDEPENDENT_AMBULATORY_CARE_PROVIDER_SITE_OTHER): Payer: Medicare Other | Admitting: Cardiology

## 2021-10-07 ENCOUNTER — Encounter: Payer: Self-pay | Admitting: Cardiology

## 2021-10-07 ENCOUNTER — Other Ambulatory Visit: Payer: Self-pay

## 2021-10-07 VITALS — BP 100/60 | HR 60 | Ht 63.0 in | Wt 139.0 lb

## 2021-10-07 DIAGNOSIS — R200 Anesthesia of skin: Secondary | ICD-10-CM | POA: Diagnosis not present

## 2021-10-07 DIAGNOSIS — R072 Precordial pain: Secondary | ICD-10-CM | POA: Diagnosis not present
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
## 2021-10-07 NOTE — Patient Instructions (Signed)
Medication Instructions:  Your physician recommends that you continue on your current medications as directed. Please refer to the Current Medication list given to you today.  *If you need a refill on your cardiac medications before your next appointment, please call your pharmacy*   Lab Work: None ordered If you have labs (blood work) drawn today and your tests are completely normal, you will receive your results only by: MyChart Message (if you have MyChart) OR A paper copy in the mail If you have any lab test that is abnormal or we need to change your treatment, we will call you to review the results.   Testing/Procedures:  Your physician has requested that you have an echocardiogram. Echocardiography is a painless test that uses sound waves to create images of your heart. It provides your doctor with information about the size and shape of your heart and how well your heart's chambers and valves are working. This procedure takes approximately one hour. There are no restrictions for this procedure.    Follow-Up: At [facility], you and your health needs are our priority.  As part of our continuing mission to provide you with exceptional heart care, we have created designated Provider Care Teams.  These Care Teams include your primary Cardiologist (physician) and Advanced Practice Providers (APPs -  Physician Assistants and Nurse Practitioners) who all work together to provide you with the care you need, when you need it.  We recommend signing up for the patient portal called "MyChart".  Sign up information is provided on this After Visit Summary.  MyChart is used to connect with patients for Virtual Visits (Telemedicine).  Patients are able to view lab/test results, encounter notes, upcoming appointments, etc.  Non-urgent messages can be sent to your provider as well.   To learn more about what you can do with MyChart, go to [email or web address].    Your next appointment:   Follow  up after Echo   The format for your next appointment:   In Person  Provider:   You may see Dr. Azucena Cecil or one of the following Advanced Practice Providers on your designated Care Team:   Nicolasa Ducking, NP Eula Listen, PA-C Cadence Fransico Michael, [place]    Other Instructions

## 2021-11-15 ENCOUNTER — Other Ambulatory Visit: Payer: Medicare Other
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
## 2021-12-01 ENCOUNTER — Ambulatory Visit: Payer: Medicare Other | Admitting: Physician Assistant

## 2021-12-02 ENCOUNTER — Encounter: Payer: Self-pay | Admitting: Physician Assistant

## 2022-01-03 ENCOUNTER — Emergency Department
Admission: EM | Admit: 2022-01-03 | Discharge: 2022-01-03 | Disposition: A | Discharge status: Home / Self Care | Payer: Medicare Other | Attending: Emergency Medicine | Admitting: Emergency Medicine

## 2022-01-03 ENCOUNTER — Other Ambulatory Visit: Payer: Self-pay

## 2022-01-03 DIAGNOSIS — J020 Streptococcal pharyngitis: Secondary | ICD-10-CM | POA: Insufficient documentation

## 2022-01-03 DIAGNOSIS — Z20822 Contact with and (suspected) exposure to covid-19: Secondary | ICD-10-CM | POA: Diagnosis not present

## 2022-01-03 DIAGNOSIS — J029 Acute pharyngitis, unspecified: Secondary | ICD-10-CM | POA: Diagnosis present

## 2022-01-03 LAB — RESP PANEL BY RT-PCR (FLU A&B, COVID) ARPGX2
Influenza A by PCR: NEGATIVE
Influenza B by PCR: NEGATIVE
SARS Coronavirus 2 by RT PCR: NEGATIVE

## 2022-01-03 LAB — GROUP A STREP BY PCR: Group A Strep by PCR: DETECTED — AB

## 2022-01-03 MED ORDER — CEFUROXIME AXETIL 250 MG PO TABS: 250.0000 mg | ORAL_TABLET | Freq: Two times a day (BID) | ORAL | 0 refills | Status: AC

## 2022-01-03 MED ORDER — DEXAMETHASONE 10 MG/ML FOR PEDIATRIC ORAL USE
10.0000 mg | Freq: Once | INTRAMUSCULAR | Status: AC
  Administered 2022-01-03: 10 mg via ORAL
  Filled 2022-01-03: qty 1

## 2022-01-03 MED ORDER — KETOROLAC TROMETHAMINE 30 MG/ML IJ SOLN
30.0000 mg | Freq: Once | INTRAMUSCULAR | Status: AC
  Administered 2022-01-03: 30 mg via INTRAMUSCULAR
  Filled 2022-01-03: qty 1

## 2022-01-03 NOTE — ED Triage Notes (Signed)
Pt presents via POV c/o sore throat and headache starting today. Currently afebrile. Denies cough and congestion.

## 2022-01-03 NOTE — ED Provider Notes (Signed)
[facility] Provider Note    Event Date/Time   First MD Initiated Contact with Patient 01/03/22 1948     (approximate)   History   Chief Complaint Sore Throat and Headache   HPI  Renee [name] is a 31 y.o. female with past medical history of anxiety and depression who presents to the ED complaining of sore throat.  Patient reports that she woke up with bilateral sore throat this evening, which she describes as a sharp pain worse when she goes to swallow.  This been associated with diffuse headache but she denies any fevers or neck stiffness.  She has not had any cough, chest pain, shortness of breath, nausea, or vomiting.  She is not aware of any sick contacts.  She has not taken anything for her symptoms prior to arrival.  She is requesting medication for her throat that would not interact with her Subutex.     Physical Exam   Triage Vital Signs: ED Triage Vitals  Enc Vitals Group     BP 01/03/22 1926 112/70     Pulse Rate 01/03/22 1926 88     Resp 01/03/22 1926 14     Temp 01/03/22 1926 98.8 F (37.1 C)     Temp Source 01/03/22 1926 Oral     SpO2 01/03/22 1926 100 %     Weight 01/03/22 1927 144 lb (65.3 kg)     Height --      Head Circumference --      Peak Flow --      Pain Score 01/03/22 1927 8     Pain Loc --      Pain Edu? --      Excl. in GC? --     Most recent vital signs: Vitals:   01/03/22 1926  BP: 112/70  Pulse: 88  Resp: 14  Temp: 98.8 F (37.1 C)  SpO2: 100%    Constitutional: Alert and oriented. Eyes: Conjunctivae are normal. Head: Atraumatic. Nose: No congestion/rhinnorhea. Mouth/Throat: Mucous membranes are moist.  Mild bilateral tonsillar edema and erythema with associated exudates, no unilateral edema or uvular deviation noted. Neck: No meningismus. Cardiovascular: Normal rate, regular rhythm. Grossly normal heart sounds.  2+ radial pulses bilaterally. Respiratory: Normal respiratory effort.  No retractions.  Lungs CTAB. Gastrointestinal: Soft and nontender. No distention. Musculoskeletal: No lower extremity tenderness nor edema.  Neurologic:  Normal speech and language. No gross focal neurologic deficits are appreciated.    ED Results / Procedures / Treatments   Labs (all labs ordered are listed, but only abnormal results are displayed) Labs Reviewed  GROUP A STREP BY PCR - Abnormal; Notable for the following components:      Result Value   Group A Strep by PCR DETECTED (*)    All other components within normal limits  RESP PANEL BY RT-PCR (FLU A&B, COVID) ARPGX2     PROCEDURES:  Critical Care performed: No  Procedures   MEDICATIONS ORDERED IN ED: Medications  dexamethasone (DECADRON) 10 MG/ML injection for Pediatric ORAL use 10 mg (10 mg Oral Given 01/03/22 2022)  ketorolac (TORADOL) 30 MG/ML injection 30 mg (30 mg Intramuscular Given 01/03/22 2022)     IMPRESSION / MDM / ASSESSMENT AND PLAN / ED COURSE  I reviewed the triage vital signs and the nursing notes.                              30 y.o.  female with past medical history of anxiety and depression who presents to the ED complaining of sore throat and headache starting earlier this morning.  Differential diagnosis includes, but is not limited to, strep pharyngitis, viral pharyngitis, peritonsillar abscess, tension headache, meningitis, SAH.  Patient is nontoxic-appearing and in no acute distress, vital signs are reassuring.  She has bilateral tonsillar edema, erythema, and exudates but no unilateral swelling or uvular deviation to suggest peritonsillar abscess.  Strep testing is positive, we will treat symptomatically with oral and IM Toradol, plan for outpatient management with antibiotics.  No concerning features to suggest meningitis or SAH and no focal neurologic deficits on exam to necessitate CT imaging of head.  Testing for strep is positive and we will treat with cefuroxime given patient's mild penicillin allergy,  she has tolerated cephalosporins in the past.  Testing for COVID-19 and influenza is negative and she is feeling better following symptomatic treatment.  She was counseled to follow-up as needed with her PCP and to return to the ED for new or worsening symptoms, patient agrees with plan.      FINAL CLINICAL IMPRESSION(S) / ED DIAGNOSES   Final diagnoses:  Strep pharyngitis     Rx / DC Orders   ED Discharge Orders          Ordered    cefUROXime (CEFTIN) 250 MG tablet  2 times daily with meals        01/03/22 2038             Note:  This document was prepared using Dragon voice recognition software and may include unintentional dictation errors.   Chesley Noon, MD 01/03/22 2039

## 2022-02-26 ENCOUNTER — Emergency Department (HOSPITAL_COMMUNITY)
Admission: EM | Admit: 2022-02-26 | Discharge: 2022-02-26 | Disposition: A | Discharge status: Home / Self Care | Payer: Medicare Other | Attending: Emergency Medicine | Admitting: Emergency Medicine

## 2022-02-26 ENCOUNTER — Encounter (HOSPITAL_COMMUNITY): Payer: Self-pay | Admitting: *Deleted

## 2022-02-26 ENCOUNTER — Emergency Department (HOSPITAL_COMMUNITY): Payer: Medicare Other

## 2022-02-26 ENCOUNTER — Other Ambulatory Visit: Payer: Self-pay

## 2022-02-26 DIAGNOSIS — D649 Anemia, unspecified: Secondary | ICD-10-CM | POA: Diagnosis not present

## 2022-02-26 DIAGNOSIS — R519 Headache, unspecified: Secondary | ICD-10-CM | POA: Diagnosis present

## 2022-02-26 DIAGNOSIS — Z7982 Long term (current) use of aspirin: Secondary | ICD-10-CM | POA: Diagnosis not present

## 2022-02-26 DIAGNOSIS — Z20822 Contact with and (suspected) exposure to covid-19: Secondary | ICD-10-CM | POA: Diagnosis not present

## 2022-02-26 DIAGNOSIS — R110 Nausea: Secondary | ICD-10-CM | POA: Insufficient documentation

## 2022-02-26 LAB — COMPREHENSIVE METABOLIC PANEL
ALT: 18 U/L (ref 0–44)
AST: 19 U/L (ref 15–41)
Albumin: 4.2 g/dL (ref 3.5–5.0)
Alkaline Phosphatase: 48 U/L (ref 38–126)
Anion gap: 5 (ref 5–15)
BUN: 17 mg/dL (ref 6–20)
CO2: 25 mmol/L (ref 22–32)
Calcium: 9.2 mg/dL (ref 8.9–10.3)
Chloride: 109 mmol/L (ref 98–111)
Creatinine, Ser: 0.62 mg/dL (ref 0.44–1.00)
GFR, Estimated: >60 mL/min (ref >60)
Glucose, Bld: 116 mg/dL — ABNORMAL HIGH (ref 70–99)
Potassium: 4.1 mmol/L (ref 3.5–5.1)
Sodium: 139 mmol/L (ref 135–145)
Total Bilirubin: 0.3 mg/dL (ref 0.3–1.2)
Total Protein: 7.1 g/dL (ref 6.5–8.1)

## 2022-02-26 LAB — CBC WITH DIFFERENTIAL/PLATELET
Abs Immature Granulocytes: 0.01 10*3/uL (ref 0.00–0.07)
Basophils Absolute: 0 10*3/uL (ref 0.0–0.1)
Basophils Relative: 1 %
Eosinophils Absolute: 0.1 10*3/uL (ref 0.0–0.5)
Eosinophils Relative: 2 %
HCT: 31.2 % — ABNORMAL LOW (ref 36.0–46.0)
Hemoglobin: 9.1 g/dL — ABNORMAL LOW (ref 12.0–15.0)
Immature Granulocytes: 0 %
Lymphocytes Relative: 41 %
Lymphs Abs: 2.4 10*3/uL (ref 0.7–4.0)
MCH: 21.3 pg — ABNORMAL LOW (ref 26.0–34.0)
MCHC: 29.2 g/dL — ABNORMAL LOW (ref 30.0–36.0)
MCV: 73.1 fL — ABNORMAL LOW (ref 80.0–100.0)
Monocytes Absolute: 0.4 10*3/uL (ref 0.1–1.0)
Monocytes Relative: 6 %
Neutro Abs: 3 10*3/uL (ref 1.7–7.7)
Neutrophils Relative %: 50 %
Platelets: 227 10*3/uL (ref 150–400)
RBC: 4.27 MIL/uL (ref 3.87–5.11)
RDW: 15.2 % (ref 11.5–15.5)
WBC: 5.8 10*3/uL (ref 4.0–10.5)
nRBC: 0 % (ref 0.0–0.2)

## 2022-02-26 LAB — RESP PANEL BY RT-PCR (FLU A&B, COVID) ARPGX2
Influenza A by PCR: NEGATIVE
Influenza B by PCR: NEGATIVE
SARS Coronavirus 2 by RT PCR: NEGATIVE

## 2022-02-26 LAB — I-STAT CHEM 8, ED
BUN: 15 mg/dL (ref 6–20)
Calcium, Ion: 1.19 mmol/L (ref 1.15–1.40)
Chloride: 106 mmol/L (ref 98–111)
Creatinine, Ser: 0.6 mg/dL (ref 0.44–1.00)
Glucose, Bld: 107 mg/dL — ABNORMAL HIGH (ref 70–99)
HCT: 29 % — ABNORMAL LOW (ref 36.0–46.0)
Hemoglobin: 9.9 g/dL — ABNORMAL LOW (ref 12.0–15.0)
Potassium: 4 mmol/L (ref 3.5–5.1)
Sodium: 139 mmol/L (ref 135–145)
TCO2: 25 mmol/L (ref 22–32)

## 2022-02-26 LAB — HCG, SERUM, QUALITATIVE: Preg, Serum: NEGATIVE

## 2022-02-26 MED ORDER — METOCLOPRAMIDE HCL 5 MG/ML IJ SOLN
10.0000 mg | Freq: Once | INTRAMUSCULAR | Status: AC
  Administered 2022-02-26: 10 mg via INTRAVENOUS
  Filled 2022-02-26: qty 2

## 2022-02-26 MED ORDER — KETOROLAC TROMETHAMINE 15 MG/ML IJ SOLN
15.0000 mg | Freq: Once | INTRAMUSCULAR | Status: AC
  Administered 2022-02-26: 15 mg via INTRAVENOUS
  Filled 2022-02-26: qty 1

## 2022-02-26 MED ORDER — LACTATED RINGERS IV BOLUS
1000.0000 mL | Freq: Once | INTRAVENOUS | Status: AC
  Administered 2022-02-26: 1000 mL via INTRAVENOUS

## 2022-02-26 MED ORDER — ACETAMINOPHEN 325 MG PO TABS
650.0000 mg | ORAL_TABLET | Freq: Once | ORAL | Status: AC
  Administered 2022-02-26: 650 mg via ORAL
  Filled 2022-02-26: qty 2

## 2022-02-26 NOTE — Discharge Instructions (Addendum)
Today your blood work showed that you are anemic.  This is when your blood counts are low.  This is very frequently caused by not having enough iron in your diet. ?Please start taking a multivitamin containing iron and set up a follow-up appointment with your primary care doctor in the next month for further evaluation.  If you develop fevers, or have any new or concerning symptoms please seek additional medical care and evaluation.  Aside from the mild anemia today your CT scan on your head and the rest of your evaluation was very reassuring.  Please make sure you are drinking plenty water and staying well-hydrated. ?

## 2022-02-26 NOTE — ED Notes (Signed)
Patient ambulated  to restroom without assistance.

## 2022-02-26 NOTE — ED Provider Notes (Signed)
?[facility] EM[facility] DEPARTMENT ?Provider Note ? ? ?CSN: [id number] ?Arrival date & time: 02/26/22  1545 ? ?  ? ?History ? ?Chief Complaint  ?Patient presents with  ? Headache  ? ? ?Renee [name] is a 31 y.o. female with a past medical history of anemia requiring transfusion, anxiety, depression, who presents today for evaluation of right-sided headache and fatigue.  She states that over the past 3 days she has had a right-sided headache and felt very fatigued.  She states that when she felt similar in the past she required a transfusion.  She denies any fevers.  She reports nausea, denies vomiting.  She denies any dysuria or abdominal pain.  She states that she does not regularly get headaches. ? ? ? ?HPI ? ?  ? ?Home Medications ?Prior to Admission medications   ?Medication Sig Start Date End Date Taking? Authorizing Provider  ?buprenorphine (SUBUTEX) 8 MG SUBL SL tablet Place 8 mg under the tongue 2 (two) times a day.  02/20/19  Yes [provider]  ?ibuprofen (ADVIL) 200 MG tablet Take 400 mg by mouth every 6 (six) hours as needed for headache.   Yes [provider]  ?acetaminophen (TYLENOL) 500 MG tablet Take 2 tablets (1,000 mg total) by mouth every 6 (six) hours as needed for mild pain or moderate pain. ?Patient not taking: Reported on 10/07/2021 05/18/19   Gunnar BullaLawhorn, Jenkins Michelle, CNM  ?amoxicillin (AMOXIL) 500 MG capsule Take 1 capsule (500 mg total) by mouth 3 (three) times daily. ?Patient not taking: Reported on 02/26/2022 03/11/21   Joni ReiningSmith, Ronald K, PA-C  ?aspirin EC 81 MG tablet Take 1 tablet (81 mg total) by mouth daily. Swallow whole. ?Patient not taking: Reported on 10/07/2021 09/22/21 09/22/22  Nita SickleVeronese, Beaver, MD  ?hydrocortisone ([facility]O-MED [facility]) 2.5 % rectal cream Place 1 application rectally 2 (two) times daily. ?Patient not taking: Reported on 10/07/2021 07/08/20   Doreene Burkehompson, Annie, CNM  ?ibuprofen (ADVIL) 800 MG tablet Take 1 tablet (800 mg total) by mouth every 8 (eight) hours as  needed. ?Patient not taking: Reported on 10/07/2021 03/11/21   Joni ReiningSmith, Ronald K, PA-C  ?lidocaine (XYLOCAINE) 2 % solution Use as directed 5 mLs in the mouth or throat every 6 (six) hours as needed for mouth pain. For oral swish ?Patient not taking: Reported on 10/07/2021 03/11/21   Joni ReiningSmith, Ronald K, PA-C  ?Prenatal-DSS-FeCb-FeGl-FA (CITRANATAL BLOOM) 90-1 MG TABS Take 1 tablet by mouth daily. ?Patient not taking: Reported on 10/07/2021 05/08/19   Purcell NailsShambley, Melody N, CNM  ?   ? ?Allergies    ?Hydrocodone, Morphine and related, and Penicillins   ? ?Review of Systems   ?Review of Systems ? ?Physical Exam ?Updated Vital Signs ?BP 110/67   Pulse 65   Temp 98.1 ?F (36.7 ?C)   Resp 17   Ht 5\' 3"  (1.6 m)   Wt 64.9 kg   LMP 01/29/2022   SpO2 99%   BMI 25.33 kg/m?  ?Physical Exam ?Vitals and nursing note reviewed.  ?Constitutional:   ?   General: She is not in acute distress. ?   Appearance: She is not ill-appearing.  ?HENT:  ?   Head: Normocephalic and atraumatic.  ?Eyes:  ?   General: No visual field deficit. ?   Extraocular Movements: Extraocular movements intact.  ?   Right eye: No nystagmus.  ?   Left eye: No nystagmus.  ?   Conjunctiva/sclera: Conjunctivae normal.  ?   Pupils: Pupils are unequal.  ?   Comments:  The right I was minimally larger than the left pupil by 1 to 2 mm.  Full EOMs bilaterally.  No APD, bilateral conjunctiva are not injected or erythematous.  There is no ptosis.  ?Cardiovascular:  ?   Rate and Rhythm: Normal rate and regular rhythm.  ?   Heart sounds: Normal heart sounds. No murmur heard. ?Pulmonary:  ?   Effort: Pulmonary effort is normal. No respiratory distress.  ?   Breath sounds: Normal breath sounds.  ?Abdominal:  ?   General: There is no distension.  ?Musculoskeletal:  ?   Cervical back: Normal range of motion and neck supple.  ?   Comments: No obvious acute injury  ?Skin: ?   General: Skin is warm.  ?Neurological:  ?   Mental Status: She is alert and oriented to person, place, and  time.  ?   Cranial Nerves: No cranial nerve deficit or dysarthria.  ?   Sensory: No sensory deficit.  ?   Motor: No weakness.  ?   Comments: Awake and alert, answers all questions appropriately.  Speech is not slurred. ?5/5 strength bilateral upper and lower extremities.  Coordination is grossly intact.  Mild anisocoria as discussed under eyes.  Otherwise cranial nerves are intact on my exam without vision field loss.  No facial droop.  ?Psychiatric:     ?   Mood and Affect: Mood normal.     ?   Behavior: Behavior normal.  ? ? ?ED Results / Procedures / Treatments   ?Labs ?(all labs ordered are listed, but only abnormal results are displayed) ?Labs Reviewed  ?COMPREHENSIVE METABOLIC PANEL - Abnormal; Notable for the following components:  ?    Result Value  ? Glucose, Bld 116 (*)   ? All other components within normal limits  ?CBC WITH DIFFERENTIAL/PLATELET - Abnormal; Notable for the following components:  ? Hemoglobin 9.1 (*)   ? HCT 31.2 (*)   ? MCV 73.1 (*)   ? MCH 21.3 (*)   ? MCHC 29.2 (*)   ? All other components within normal limits  ?I-STAT CHEM 8, ED - Abnormal; Notable for the following components:  ? Glucose, Bld 107 (*)   ? Hemoglobin 9.9 (*)   ? HCT 29.0 (*)   ? All other components within normal limits  ?RESP PANEL BY RT-PCR (FLU A&B, COVID) ARPGX2  ?HCG, SERUM, QUALITATIVE  ? ? ?EKG ?None ? ?Radiology ?CT Head Wo Contrast ? ?Result Date: 02/26/2022 ?CLINICAL DATA:  Headache, sudden, severe EXAM: CT HEAD WITHOUT CONTRAST TECHNIQUE: Contiguous axial images were obtained from the base of the skull through the vertex without intravenous contrast. RADIATION DOSE REDUCTION: This exam was performed according to the departmental dose-optimization program which includes automated exposure control, adjustment of the mA and/or kV according to patient size and/or use of iterative reconstruction technique. COMPARISON:  None. FINDINGS: Brain: No evidence of acute infarction, hemorrhage, hydrocephalus, extra-axial  collection or mass lesion/mass effect. Vascular: No hyperdense vessel or unexpected calcification. Skull: Normal. Negative for fracture or focal lesion. Sinuses/Orbits: No acute finding. Other: None. IMPRESSION: No acute intracranial process. Electronically Signed   By: Duanne Guess D.O.   On: 02/26/2022 17:55   ? ?Procedures ?Procedures  ? ? ?Medications Ordered in ED ?Medications  ?acetaminophen (TYLENOL) tablet 650 mg (650 mg Oral Given 02/26/22 1641)  ?lactated ringers bolus 1,000 mL (0 mLs Intravenous Stopped 02/26/22 1841)  ?ketorolac (TORADOL) 15 MG/ML injection 15 mg (15 mg Intravenous Given 02/26/22 1941)  ?metoCLOPramide (REGLAN) injection  10 mg (10 mg Intravenous Given 02/26/22 1941)  ? ? ?ED Course/ Medical Decision Making/ A&P ?Clinical Course as of 02/27/22 1321  ?Sat Feb 26, 2022  ?1810 CT Head Wo Contrast ?Normal, no acute abnormalities [EH]  ?1810 Hemoglobin(!): 9.1 ?Appears consistent with patient's baseline. [EH]  ?1811 Resp Panel by RT-PCR (Flu A&B, Covid) Nasopharyngeal Swab ?Negative for flu and COVID [EH]  ?1811 hCG, serum, qualitative ?pregncy test is negative [EH]  ?1930 I spoke with Dr. Amada Jupiter, he recommends trialing treatment with Toradol and Reglan.  If this resolves patient's headache then she is good to go home.  If she continues to have headache then she needs a CTA of her head and neck with a venogram additionally. [EH]  ?2113 Patient is reevaluated, she reports that her headache is fully gone and she feels back to baseline. [EH]  ?  ?Clinical Course User Index ?[EH] Cristina Gong, PA-C  ? ?                        ?Medical Decision Making ?Patient is a 31 year old woman who presents today for evaluation of headache and fatigue.  She states that when she has felt similar in the past she has required a transfusion.  She denies any trauma, and does not frequently have headaches. ?Labs are obtained and reviewed, hemoglobin is 9.1 which is consistent with her baseline.  Pregnancy  test is negative, CMP without any acute electrolyte derangements. ?Her blood pressures were softer in the emergency room running in the 80s and 90s.  On chart review from multiple encounters, when patient is not pr

## 2022-02-26 NOTE — ED Triage Notes (Signed)
Pt with fatigue and c/o HA.  Last time she had HA like this, pt needed a blood transfusion.  ?

## 2022-02-26 NOTE — ED Notes (Signed)
Went over d/c papers with patient. All questions answered. Denies any pain. Alert and oriented. Ambulatory to lobby.  ?

## 2023-12-06 ENCOUNTER — Encounter: Payer: Self-pay | Admitting: Internal Medicine

## 2023-12-13 ENCOUNTER — Encounter: Payer: Self-pay | Admitting: Internal Medicine

## 2023-12-17 ENCOUNTER — Other Ambulatory Visit: Payer: Self-pay | Admitting: Oncology

## 2023-12-17 DIAGNOSIS — D508 Other iron deficiency anemias: Secondary | ICD-10-CM
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
## 2023-12-18 ENCOUNTER — Encounter: Payer: Self-pay | Admitting: Internal Medicine

## 2023-12-18 ENCOUNTER — Inpatient Hospital Stay: Payer: 59

## 2023-12-18 ENCOUNTER — Encounter: Payer: Self-pay | Admitting: Oncology

## 2023-12-18 ENCOUNTER — Inpatient Hospital Stay: Payer: 59 | Attending: Oncology | Admitting: Oncology

## 2023-12-18 VITALS — BP 112/70 | HR 78 | Temp 98.6°F | Resp 14 | Ht 63.0 in | Wt 151.3 lb

## 2023-12-18 DIAGNOSIS — D508 Other iron deficiency anemias: Secondary | ICD-10-CM

## 2023-12-18 DIAGNOSIS — D509 Iron deficiency anemia, unspecified: Secondary | ICD-10-CM | POA: Diagnosis not present

## 2023-12-18 LAB — CBC WITH DIFFERENTIAL (CANCER CENTER ONLY)
Abs Immature Granulocytes: 0.01 10*3/uL (ref 0.00–0.07)
Basophils Absolute: 0 10*3/uL (ref 0.0–0.1)
Basophils Relative: 1 %
Eosinophils Absolute: 0.1 10*3/uL (ref 0.0–0.5)
Eosinophils Relative: 2 %
HCT: 28.6 % — ABNORMAL LOW (ref 36.0–46.0)
Hemoglobin: 8.8 g/dL — ABNORMAL LOW (ref 12.0–15.0)
Immature Granulocytes: 0 %
Lymphocytes Relative: 33 %
Lymphs Abs: 2.2 10*3/uL (ref 0.7–4.0)
MCH: 21.8 pg — ABNORMAL LOW (ref 26.0–34.0)
MCHC: 30.8 g/dL (ref 30.0–36.0)
MCV: 71 fL — ABNORMAL LOW (ref 80.0–100.0)
Monocytes Absolute: 0.4 10*3/uL (ref 0.1–1.0)
Monocytes Relative: 5 %
Neutro Abs: 4 10*3/uL (ref 1.7–7.7)
Neutrophils Relative %: 59 %
Platelet Count: 218 10*3/uL (ref 150–400)
RBC: 4.03 MIL/uL (ref 3.87–5.11)
RDW: 15.9 % — ABNORMAL HIGH (ref 11.5–15.5)
WBC Count: 6.8 10*3/uL (ref 4.0–10.5)
nRBC: 0 % (ref 0.0–0.2)
nRBC: 0 /100{WBCs}

## 2023-12-19 ENCOUNTER — Inpatient Hospital Stay: Payer: 59

## 2023-12-19 VITALS — BP 90/57 | HR 64 | Temp 98.5°F | Resp 16

## 2023-12-19 DIAGNOSIS — D508 Other iron deficiency anemias: Secondary | ICD-10-CM

## 2023-12-19 DIAGNOSIS — O99012 Anemia complicating pregnancy, second trimester: Secondary | ICD-10-CM

## 2023-12-19 DIAGNOSIS — D509 Iron deficiency anemia, unspecified: Secondary | ICD-10-CM | POA: Diagnosis not present

## 2023-12-19 MED ORDER — IRON SUCROSE 20 MG/ML IV SOLN
200.0000 mg | Freq: Once | INTRAVENOUS | Status: AC
  Administered 2023-12-19: 200 mg via INTRAVENOUS
  Filled 2023-12-19: qty 10

## 2023-12-19 NOTE — Patient Instructions (Signed)

## 2023-12-20 ENCOUNTER — Ambulatory Visit: Payer: 59

## 2023-12-20 ENCOUNTER — Encounter: Payer: Self-pay | Admitting: Oncology

## 2023-12-21 ENCOUNTER — Inpatient Hospital Stay: Payer: 59

## 2023-12-21 VITALS — BP 101/59 | HR 75 | Temp 98.0°F | Resp 18

## 2023-12-21 DIAGNOSIS — D508 Other iron deficiency anemias: Secondary | ICD-10-CM

## 2023-12-21 DIAGNOSIS — D509 Iron deficiency anemia, unspecified: Secondary | ICD-10-CM | POA: Diagnosis not present

## 2023-12-21 DIAGNOSIS — O99012 Anemia complicating pregnancy, second trimester: Secondary | ICD-10-CM

## 2023-12-21 MED ORDER — SODIUM CHLORIDE 0.9 % IV SOLN: INTRAVENOUS | Status: DC

## 2023-12-21 MED ORDER — IRON SUCROSE 20 MG/ML IV SOLN
200.0000 mg | Freq: Once | INTRAVENOUS | Status: AC
  Administered 2023-12-21: 200 mg via INTRAVENOUS
  Filled 2023-12-21: qty 10

## 2023-12-21 NOTE — Patient Instructions (Signed)

## 2023-12-22 ENCOUNTER — Inpatient Hospital Stay: Payer: 59

## 2023-12-22 ENCOUNTER — Encounter: Payer: Self-pay | Admitting: Oncology

## 2023-12-22 VITALS — BP 101/61 | HR 62 | Temp 98.0°F | Resp 18

## 2023-12-22 DIAGNOSIS — D509 Iron deficiency anemia, unspecified: Secondary | ICD-10-CM | POA: Diagnosis not present

## 2023-12-22 DIAGNOSIS — D508 Other iron deficiency anemias: Secondary | ICD-10-CM

## 2023-12-22 DIAGNOSIS — O99012 Anemia complicating pregnancy, second trimester: Secondary | ICD-10-CM

## 2023-12-22 MED ORDER — SODIUM CHLORIDE 0.9 % IV SOLN: INTRAVENOUS | Status: DC

## 2023-12-22 MED ORDER — IRON SUCROSE 20 MG/ML IV SOLN
200.0000 mg | Freq: Once | INTRAVENOUS | Status: AC
  Administered 2023-12-22: 200 mg via INTRAVENOUS
  Filled 2023-12-22: qty 10

## 2023-12-22 NOTE — Patient Instructions (Signed)

## 2023-12-25 ENCOUNTER — Inpatient Hospital Stay: Payer: 59

## 2023-12-25 VITALS — BP 94/55 | HR 59 | Temp 98.5°F | Resp 18 | Ht 63.0 in | Wt 151.1 lb

## 2023-12-25 DIAGNOSIS — D509 Iron deficiency anemia, unspecified: Secondary | ICD-10-CM | POA: Diagnosis not present

## 2023-12-25 DIAGNOSIS — D508 Other iron deficiency anemias: Secondary | ICD-10-CM

## 2023-12-25 DIAGNOSIS — O99012 Anemia complicating pregnancy, second trimester: Secondary | ICD-10-CM

## 2023-12-25 MED ORDER — IRON SUCROSE 20 MG/ML IV SOLN
200.0000 mg | Freq: Once | INTRAVENOUS | Status: AC
  Administered 2023-12-25: 200 mg via INTRAVENOUS
  Filled 2023-12-25: qty 10

## 2023-12-25 NOTE — Patient Instructions (Addendum)
Iron-Rich Diet  Iron is a mineral that helps your body produce hemoglobin. Hemoglobin is a protein in red blood cells that carries oxygen to your body's tissues. Eating too little iron may cause you to feel weak and tired, and it can increase your risk of infection. Iron is naturally found in many foods, and many foods have iron added to them (are iron-fortified). You may need to follow an iron-rich diet if you do not have enough iron in your body due to certain medical conditions. The amount of iron that you need each day depends on your age, your sex, and any medical conditions you have. Follow instructions from your health care provider or a dietitian about how much iron you should eat each day. What are tips for following this plan? Reading food labels Check food labels to see how many milligrams (mg) of iron are in each serving. Cooking Cook foods in pots and pans that are made from iron. Take these steps to make it easier for your body to absorb iron from certain foods: Soak beans overnight before cooking. Soak whole grains overnight and drain them before using. Ferment flours before baking, such as by using yeast in bread dough. Meal planning When you eat foods that contain iron, you should eat them with foods that are high in vitamin C. These include oranges, peppers, tomatoes, potatoes, and mangoes. Vitamin C helps your body absorb iron. Certain foods and drinks prevent your body from absorbing iron properly. Avoid eating these foods in the same meal as iron-rich foods or with iron supplements. These foods include: Coffee, black tea, and red wine. Milk, dairy products, and foods that are high in calcium. Beans and soybeans. Whole grains. General information Take iron supplements only as told by your health care provider. An overdose of iron can be life-threatening. If you were prescribed iron supplements, take them with orange juice or a vitamin C supplement. When you eat  iron-fortified foods or take an iron supplement, you should also eat foods that naturally contain iron, such as meat, poultry, and fish. Eating naturally iron-rich foods helps your body absorb the iron that is added to other foods or contained in a supplement. Iron from animal sources is better absorbed than iron from plant sources. What foods should I eat? Fruits Prunes. Raisins. Eat fruits high in vitamin C, such as oranges, grapefruits, and strawberries, with iron-rich foods. Vegetables Spinach (cooked). Green peas. Broccoli. Fermented vegetables. Eat vegetables high in vitamin C, such as leafy greens, potatoes, bell peppers, and tomatoes, with iron-rich foods. Grains Iron-fortified breakfast cereal. Iron-fortified whole-wheat bread. Enriched rice. Sprouted grains. Meats and other proteins Beef liver. Beef. [place]. Chicken. Oysters. Shrimp. Tuna. Sardines. Chickpeas. Nuts. Tofu. Pumpkin seeds. Beverages Tomato juice. Fresh orange juice. Prune juice. Hibiscus tea. Iron-fortified instant breakfast shakes. Sweets and desserts Blackstrap molasses. Seasonings and condiments Tahini. Fermented soy sauce. Other foods Wheat germ. The items listed above may not be a complete list of recommended foods and beverages. Contact a dietitian for more information. What foods should I limit? These are foods that should be limited while eating iron-rich foods as they can reduce the absorption of iron in your body. Grains Whole grains. Bran cereal. Bran flour. Meats and other proteins Soybeans. Products made from soy protein. Black beans. Lentils. Mung beans. Split peas. Dairy Milk. Cream. Cheese. Yogurt. Cottage cheese. Beverages Coffee. Black tea. Red wine. Sweets and desserts Cocoa. Chocolate. Ice cream. Seasonings and condiments Basil. Oregano. Large amounts of parsley. The items listed  above may not be a complete list of foods and beverages you should limit. Contact a dietitian for more  information. Summary Iron is a mineral that helps your body produce hemoglobin. Hemoglobin is a protein in red blood cells that carries oxygen to your body's tissues. Iron is naturally found in many foods, and many foods have iron added to them (are iron-fortified). When you eat foods that contain iron, you should eat them with foods that are high in vitamin C. Vitamin C helps your body absorb iron. Certain foods and drinks prevent your body from absorbing iron properly, such as whole grains and dairy products. You should avoid eating these foods in the same meal as iron-rich foods or with iron supplements. This information is not intended to replace advice given to you by your health care provider. Make sure you discuss any questions you have with your health care provider. Document Revised: 10/26/2020 Document Reviewed: 10/26/2020 Elsevier Patient Education  2024 [employer].

## 2023-12-26 ENCOUNTER — Inpatient Hospital Stay: Payer: 59

## 2023-12-26 VITALS — BP 103/54 | HR 61 | Temp 97.8°F | Resp 16

## 2023-12-26 DIAGNOSIS — O99012 Anemia complicating pregnancy, second trimester: Secondary | ICD-10-CM

## 2023-12-26 DIAGNOSIS — D508 Other iron deficiency anemias: Secondary | ICD-10-CM

## 2023-12-26 DIAGNOSIS — D509 Iron deficiency anemia, unspecified: Secondary | ICD-10-CM | POA: Diagnosis not present

## 2023-12-26 MED ORDER — IRON SUCROSE 20 MG/ML IV SOLN
200.0000 mg | Freq: Once | INTRAVENOUS | Status: AC
Start: 2023-12-26 — End: 2023-12-26
  Administered 2023-12-26: 200 mg via INTRAVENOUS
  Filled 2023-12-26: qty 10

## 2023-12-26 NOTE — Patient Instructions (Signed)
Iron-Rich Diet  Iron is a mineral that helps your body produce hemoglobin. Hemoglobin is a protein in red blood cells that carries oxygen to your body's tissues. Eating too little iron may cause you to feel weak and tired, and it can increase your risk of infection. Iron is naturally found in many foods, and many foods have iron added to them (are iron-fortified). You may need to follow an iron-rich diet if you do not have enough iron in your body due to certain medical conditions. The amount of iron that you need each day depends on your age, your sex, and any medical conditions you have. Follow instructions from your health care provider or a dietitian about how much iron you should eat each day. What are tips for following this plan? Reading food labels Check food labels to see how many milligrams (mg) of iron are in each serving. Cooking Cook foods in pots and pans that are made from iron. Take these steps to make it easier for your body to absorb iron from certain foods: Soak beans overnight before cooking. Soak whole grains overnight and drain them before using. Ferment flours before baking, such as by using yeast in bread dough. Meal planning When you eat foods that contain iron, you should eat them with foods that are high in vitamin C. These include oranges, peppers, tomatoes, potatoes, and mangoes. Vitamin C helps your body absorb iron. Certain foods and drinks prevent your body from absorbing iron properly. Avoid eating these foods in the same meal as iron-rich foods or with iron supplements. These foods include: Coffee, black tea, and red wine. Milk, dairy products, and foods that are high in calcium. Beans and soybeans. Whole grains. General information Take iron supplements only as told by your health care provider. An overdose of iron can be life-threatening. If you were prescribed iron supplements, take them with orange juice or a vitamin C supplement. When you eat  iron-fortified foods or take an iron supplement, you should also eat foods that naturally contain iron, such as meat, poultry, and fish. Eating naturally iron-rich foods helps your body absorb the iron that is added to other foods or contained in a supplement. Iron from animal sources is better absorbed than iron from plant sources. What foods should I eat? Fruits Prunes. Raisins. Eat fruits high in vitamin C, such as oranges, grapefruits, and strawberries, with iron-rich foods. Vegetables Spinach (cooked). Green peas. Broccoli. Fermented vegetables. Eat vegetables high in vitamin C, such as leafy greens, potatoes, bell peppers, and tomatoes, with iron-rich foods. Grains Iron-fortified breakfast cereal. Iron-fortified whole-wheat bread. Enriched rice. Sprouted grains. Meats and other proteins Beef liver. Beef. [place]. Chicken. Oysters. Shrimp. Tuna. Sardines. Chickpeas. Nuts. Tofu. Pumpkin seeds. Beverages Tomato juice. Fresh orange juice. Prune juice. Hibiscus tea. Iron-fortified instant breakfast shakes. Sweets and desserts Blackstrap molasses. Seasonings and condiments Tahini. Fermented soy sauce. Other foods Wheat germ. The items listed above may not be a complete list of recommended foods and beverages. Contact a dietitian for more information. What foods should I limit? These are foods that should be limited while eating iron-rich foods as they can reduce the absorption of iron in your body. Grains Whole grains. Bran cereal. Bran flour. Meats and other proteins Soybeans. Products made from soy protein. Black beans. Lentils. Mung beans. Split peas. Dairy Milk. Cream. Cheese. Yogurt. Cottage cheese. Beverages Coffee. Black tea. Red wine. Sweets and desserts Cocoa. Chocolate. Ice cream. Seasonings and condiments Basil. Oregano. Large amounts of parsley. The items listed  above may not be a complete list of foods and beverages you should limit. Contact a dietitian for more  information. Summary Iron is a mineral that helps your body produce hemoglobin. Hemoglobin is a protein in red blood cells that carries oxygen to your body's tissues. Iron is naturally found in many foods, and many foods have iron added to them (are iron-fortified). When you eat foods that contain iron, you should eat them with foods that are high in vitamin C. Vitamin C helps your body absorb iron. Certain foods and drinks prevent your body from absorbing iron properly, such as whole grains and dairy products. You should avoid eating these foods in the same meal as iron-rich foods or with iron supplements. This information is not intended to replace advice given to you by your health care provider. Make sure you discuss any questions you have with your health care provider. Document Revised: 10/26/2020 Document Reviewed: 10/26/2020 Elsevier Patient Education  2024 [employer].

## 2023-12-28 ENCOUNTER — Telehealth: Payer: Self-pay

## 2023-12-28 NOTE — Telephone Encounter (Signed)
[facility] Clinical Social Work  Clinical Social Work was referred by new patient protocol for assessment of psychosocial needs.  Clinical Social Worker contacted patient by phone to offer support and assess for needs. Patient reported positive experiences so far and no direct needs. No follow up needed at this time.   Marguerita Merles, LCSW  Clinical Social Worker [facility]

## 2024-01-29 ENCOUNTER — Encounter: Payer: Self-pay | Admitting: Oncology

## 2024-02-07 ENCOUNTER — Encounter: Payer: Self-pay | Admitting: Oncology
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
## 2024-03-18 ENCOUNTER — Ambulatory Visit: Payer: 59 | Admitting: Oncology

## 2024-03-18 ENCOUNTER — Other Ambulatory Visit: Payer: Self-pay

## 2024-03-18 ENCOUNTER — Inpatient Hospital Stay

## 2024-03-18 ENCOUNTER — Other Ambulatory Visit: Payer: 59

## 2024-03-18 ENCOUNTER — Inpatient Hospital Stay: Admitting: Oncology

## 2024-03-18 DIAGNOSIS — D508 Other iron deficiency anemias: Secondary | ICD-10-CM
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
## 2024-03-20 ENCOUNTER — Inpatient Hospital Stay: Admitting: Oncology

## 2024-03-20 ENCOUNTER — Inpatient Hospital Stay
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
## 2024-03-22 ENCOUNTER — Other Ambulatory Visit

## 2024-03-22 ENCOUNTER — Ambulatory Visit: Admitting: Oncology

## 2024-03-22 ENCOUNTER — Inpatient Hospital Stay: Admitting: Oncology

## 2024-03-22 ENCOUNTER — Inpatient Hospital Stay
